# Patient Record
Sex: Male | Born: 1995 | Race: White | Hispanic: No | Marital: Single | Smoking: Never smoker
Health system: Southern US, Community
[De-identification: ages and names within clinical notes are randomized; demographics above are authoritative.]

## PROBLEM LIST (undated history)

## (undated) DIAGNOSIS — J9383 Other pneumothorax: Secondary | ICD-10-CM

---

## 2017-09-13 ENCOUNTER — Emergency Department
Admission: EM | Admit: 2017-09-13 | Discharge: 2017-09-13 | Disposition: A | Discharge status: Home / Self Care | Payer: BLUE CROSS/BLUE SHIELD | Attending: Student in an Organized Health Care Education/Training Program | Admitting: Student in an Organized Health Care Education/Training Program

## 2017-09-13 ENCOUNTER — Encounter: Payer: Self-pay | Admitting: *Deleted

## 2017-09-13 ENCOUNTER — Other Ambulatory Visit: Payer: Self-pay

## 2017-09-13 DIAGNOSIS — Y999 Unspecified external cause status: Secondary | ICD-10-CM | POA: Diagnosis not present

## 2017-09-13 DIAGNOSIS — X17XXXA Contact with hot engines, machinery and tools, initial encounter: Secondary | ICD-10-CM | POA: Diagnosis not present

## 2017-09-13 DIAGNOSIS — Y929 Unspecified place or not applicable: Secondary | ICD-10-CM | POA: Insufficient documentation

## 2017-09-13 DIAGNOSIS — S4991XA Unspecified injury of right shoulder and upper arm, initial encounter: Secondary | ICD-10-CM | POA: Diagnosis present

## 2017-09-13 DIAGNOSIS — T22191A Burn of first degree of multiple sites of right shoulder and upper limb, except wrist and hand, initial encounter: Secondary | ICD-10-CM | POA: Diagnosis not present

## 2017-09-13 DIAGNOSIS — Y9389 Activity, other specified: Secondary | ICD-10-CM | POA: Diagnosis not present

## 2017-09-13 MED ORDER — HYDROMORPHONE HCL 1 MG/ML IJ SOLN
1.0000 mg | Freq: Once | INTRAMUSCULAR | Status: DC
Start: 2017-09-13 — End: 2017-09-13
  Filled 2017-09-13: qty 1

## 2017-09-13 MED ORDER — TRAMADOL HCL 50 MG PO TABS
50.0000 mg | ORAL_TABLET | Freq: Once | ORAL | Status: DC
Start: 2017-09-13 — End: 2017-09-13

## 2017-09-13 MED ORDER — SILVER SULFADIAZINE 1 % EX CREA: TOPICAL_CREAM | Freq: Once | CUTANEOUS | Status: DC

## 2017-09-13 MED ORDER — SILVER SULFADIAZINE 1 % EX CREA
TOPICAL_CREAM | Freq: Once | CUTANEOUS | Status: AC
  Administered 2017-09-13: 20:00:00 via TOPICAL
  Filled 2017-09-13: qty 85

## 2017-09-13 MED ORDER — TRAMADOL HCL 50 MG PO TABS: 50.0000 mg | ORAL_TABLET | Freq: Two times a day (BID) | ORAL | 0 refills | Status: DC | PRN

## 2017-09-13 MED ORDER — SILVER SULFADIAZINE 1 % EX CREA: TOPICAL_CREAM | CUTANEOUS | 1 refills | Status: AC

## 2017-09-13 MED ORDER — IBUPROFEN 600 MG PO TABS: 600.0000 mg | ORAL_TABLET | Freq: Three times a day (TID) | ORAL | 0 refills | Status: DC | PRN

## 2017-09-13 NOTE — ED Notes (Signed)
Esign not working pt family verbalized discharge instructions and has no questions at this time
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
## 2019-01-04 ENCOUNTER — Encounter: Payer: Self-pay | Admitting: Emergency Medicine

## 2019-01-04 ENCOUNTER — Other Ambulatory Visit: Payer: Self-pay

## 2019-01-04 ENCOUNTER — Other Ambulatory Visit: Payer: Self-pay | Admitting: Unknown Physician Specialty

## 2019-01-04 ENCOUNTER — Inpatient Hospital Stay
Admission: EM | Admit: 2019-01-04 | Discharge: 2019-01-10 | DRG: 165 | Disposition: A | Discharge status: Home / Self Care | Payer: BC Managed Care – PPO | Attending: Cardiothoracic Surgery | Admitting: Cardiothoracic Surgery

## 2019-01-04 ENCOUNTER — Ambulatory Visit
Admission: RE | Admit: 2019-01-04 | Discharge: 2019-01-04 | Disposition: A | Discharge status: Home / Self Care | Payer: BC Managed Care – PPO | Source: Ambulatory Visit | Attending: Unknown Physician Specialty | Admitting: Unknown Physician Specialty

## 2019-01-04 ENCOUNTER — Inpatient Hospital Stay: Payer: BC Managed Care – PPO

## 2019-01-04 ENCOUNTER — Emergency Department: Payer: BC Managed Care – PPO

## 2019-01-04 DIAGNOSIS — Z938 Other artificial opening status: Secondary | ICD-10-CM

## 2019-01-04 DIAGNOSIS — Z4682 Encounter for fitting and adjustment of non-vascular catheter: Secondary | ICD-10-CM

## 2019-01-04 DIAGNOSIS — Z9689 Presence of other specified functional implants: Secondary | ICD-10-CM

## 2019-01-04 DIAGNOSIS — R0602 Shortness of breath: Secondary | ICD-10-CM

## 2019-01-04 DIAGNOSIS — Z20828 Contact with and (suspected) exposure to other viral communicable diseases: Secondary | ICD-10-CM | POA: Diagnosis present

## 2019-01-04 DIAGNOSIS — R42 Dizziness and giddiness: Secondary | ICD-10-CM | POA: Diagnosis not present

## 2019-01-04 DIAGNOSIS — J9383 Other pneumothorax: Principal | ICD-10-CM | POA: Diagnosis present

## 2019-01-04 DIAGNOSIS — J939 Pneumothorax, unspecified: Secondary | ICD-10-CM

## 2019-01-04 DIAGNOSIS — Z09 Encounter for follow-up examination after completed treatment for conditions other than malignant neoplasm: Secondary | ICD-10-CM

## 2019-01-04 DIAGNOSIS — Z8709 Personal history of other diseases of the respiratory system: Secondary | ICD-10-CM | POA: Diagnosis not present

## 2019-01-04 HISTORY — DX: Other pneumothorax: J93.83

## 2019-01-04 LAB — CBC WITH DIFFERENTIAL/PLATELET
Abs Immature Granulocytes: 0.05 10*3/uL (ref 0.00–0.07)
Basophils Absolute: 0.1 10*3/uL (ref 0.0–0.1)
Basophils Relative: 1 %
Eosinophils Absolute: 0 10*3/uL (ref 0.0–0.5)
Eosinophils Relative: 0 %
HCT: 46.8 % (ref 39.0–52.0)
Hemoglobin: 16.6 g/dL (ref 13.0–17.0)
Immature Granulocytes: 0 %
Lymphocytes Relative: 11 %
Lymphs Abs: 1.3 10*3/uL (ref 0.7–4.0)
MCH: 30.9 pg (ref 26.0–34.0)
MCHC: 35.5 g/dL (ref 30.0–36.0)
MCV: 87 fL (ref 80.0–100.0)
Monocytes Absolute: 0.6 10*3/uL (ref 0.1–1.0)
Monocytes Relative: 5 %
Neutro Abs: 9.7 10*3/uL — ABNORMAL HIGH (ref 1.7–7.7)
Neutrophils Relative %: 83 %
Platelets: 231 10*3/uL (ref 150–400)
RBC: 5.38 MIL/uL (ref 4.22–5.81)
RDW: 12 % (ref 11.5–15.5)
WBC: 11.8 10*3/uL — ABNORMAL HIGH (ref 4.0–10.5)
nRBC: 0 % (ref 0.0–0.2)

## 2019-01-04 LAB — BASIC METABOLIC PANEL
Anion gap: 9 (ref 5–15)
BUN: 16 mg/dL (ref 6–20)
CO2: 29 mmol/L (ref 22–32)
Calcium: 10 mg/dL (ref 8.9–10.3)
Chloride: 101 mmol/L (ref 98–111)
Creatinine, Ser: 0.92 mg/dL (ref 0.61–1.24)
GFR calc Af Amer: >60 mL/min (ref >60)
GFR calc non Af Amer: >60 mL/min (ref >60)
Glucose, Bld: 95 mg/dL (ref 70–99)
Potassium: 3.9 mmol/L (ref 3.5–5.1)
Sodium: 139 mmol/L (ref 135–145)

## 2019-01-04 LAB — SARS CORONAVIRUS 2 BY RT PCR (HOSPITAL ORDER, PERFORMED IN ~~LOC~~ HOSPITAL LAB): SARS Coronavirus 2: NEGATIVE

## 2019-01-04 MED ORDER — FENTANYL CITRATE (PF) 100 MCG/2ML IJ SOLN
50.0000 ug | Freq: Once | INTRAMUSCULAR | Status: AC
  Administered 2019-01-04: 17:00:00 50 ug via INTRAVENOUS
  Filled 2019-01-04: qty 2

## 2019-01-04 MED ORDER — ONDANSETRON HCL 4 MG/2ML IJ SOLN: 4.0000 mg | Freq: Four times a day (QID) | INTRAMUSCULAR | Status: DC | PRN

## 2019-01-04 MED ORDER — ENOXAPARIN SODIUM 40 MG/0.4ML ~~LOC~~ SOLN
40.0000 mg | SUBCUTANEOUS | Status: DC
  Administered 2019-01-05: 21:00:00 40 mg via SUBCUTANEOUS
  Filled 2019-01-04 (×2): qty 0.4

## 2019-01-04 MED ORDER — ACETAMINOPHEN 650 MG RE SUPP: 650.0000 mg | Freq: Four times a day (QID) | RECTAL | Status: DC | PRN

## 2019-01-04 MED ORDER — IOHEXOL 300 MG/ML  SOLN
75.0000 mL | Freq: Once | INTRAMUSCULAR | Status: AC | PRN
  Administered 2019-01-04: 20:00:00 75 mL via INTRAVENOUS

## 2019-01-04 MED ORDER — FENTANYL CITRATE (PF) 100 MCG/2ML IJ SOLN
INTRAMUSCULAR | Status: AC
  Administered 2019-01-04: 50 ug via INTRAVENOUS
  Filled 2019-01-04: qty 2

## 2019-01-04 MED ORDER — FENTANYL CITRATE (PF) 100 MCG/2ML IJ SOLN
50.0000 ug | Freq: Once | INTRAMUSCULAR | Status: AC
  Administered 2019-01-04: 18:00:00 50 ug via INTRAVENOUS

## 2019-01-04 MED ORDER — MIDAZOLAM HCL 2 MG/2ML IJ SOLN
2.0000 mg | Freq: Once | INTRAMUSCULAR | Status: AC
  Administered 2019-01-04: 17:00:00 2 mg via INTRAVENOUS
  Filled 2019-01-04: qty 2

## 2019-01-04 MED ORDER — MORPHINE SULFATE (PF) 4 MG/ML IV SOLN
INTRAVENOUS | Status: AC
  Administered 2019-01-04: 4 mg via INTRAVENOUS
  Filled 2019-01-04: qty 1

## 2019-01-04 MED ORDER — MORPHINE SULFATE (PF) 4 MG/ML IV SOLN
4.0000 mg | INTRAVENOUS | Status: DC | PRN
  Administered 2019-01-04 – 2019-01-06 (×7): 4 mg via INTRAVENOUS
  Filled 2019-01-04 (×7): qty 1

## 2019-01-04 MED ORDER — ONDANSETRON 4 MG PO TBDP: 4.0000 mg | ORAL_TABLET | Freq: Four times a day (QID) | ORAL | Status: DC | PRN

## 2019-01-04 MED ORDER — LIDOCAINE HCL (PF) 1 % IJ SOLN
10.0000 mL | Freq: Once | INTRAMUSCULAR | Status: AC
  Administered 2019-01-04: 18:00:00 10 mL via INTRADERMAL

## 2019-01-04 MED ORDER — LIDOCAINE HCL (PF) 1 % IJ SOLN
INTRAMUSCULAR | Status: AC
  Filled 2019-01-04: qty 20

## 2019-01-04 MED ORDER — ACETAMINOPHEN 325 MG PO TABS: 650.0000 mg | ORAL_TABLET | Freq: Four times a day (QID) | ORAL | Status: DC | PRN

## 2019-01-04 MED ORDER — FENTANYL CITRATE (PF) 100 MCG/2ML IJ SOLN
50.0000 ug | Freq: Once | INTRAMUSCULAR | Status: AC
Start: 2019-01-04 — End: 2019-01-04
  Administered 2019-01-04: 18:00:00 50 ug via INTRAVENOUS

## 2019-01-04 MED ORDER — HYDROCODONE-ACETAMINOPHEN 5-325 MG PO TABS
1.0000 | ORAL_TABLET | ORAL | Status: DC | PRN
  Administered 2019-01-04 – 2019-01-06 (×3): 2 via ORAL
  Filled 2019-01-04 (×3): qty 2

## 2019-01-04 MED ORDER — MORPHINE SULFATE (PF) 4 MG/ML IV SOLN
4.0000 mg | Freq: Once | INTRAVENOUS | Status: AC
  Administered 2019-01-04: 19:00:00 4 mg via INTRAVENOUS

## 2019-01-04 MED ORDER — SODIUM CHLORIDE 0.9 % IV SOLN
INTRAVENOUS | Status: DC
  Administered 2019-01-04 – 2019-01-05 (×2): via INTRAVENOUS

## 2019-01-04 NOTE — ED Provider Notes (Addendum)
[facility] Emergency Department Provider Note   ____________________________________________   First MD Initiated Contact with Patient 01/04/19 1640     (approximate)  I have reviewed the triage vital signs and the nursing notes.   HISTORY  Chief Complaint Pneumothorax    HPI Zachary Lloyd is a 23 y.o. male with past medical history of spontaneous pneumothorax who presents to the ED complaining of chest pain.  Patient reports that when he woke up this morning he had sharp pain in the right side of his chest that seemed to worsen with a deep breath.  He thought that pain felt similar to his prior spontaneous pneumothorax and got in touch with a physician friend, who was able to order a chest x-ray.  Outpatient chest x-ray showed spontaneous pneumothorax and patient was referred to the ED for further evaluation.  Patient reports ongoing chest pain on the right side, states it is painful for him to breathe but he does not feel short of breath.  He has not taken any medication for pain prior to arrival.        Past Medical History:  Diagnosis Date  . Spontaneous pneumothorax     Patient Active Problem List   Diagnosis Date Noted  . 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 No pertinent surgical history.  Prior to Admission medications   Not on File    Allergies Patient has no known allergies.  No family history on file.  Social History Social History   Tobacco Use  . Smoking status: Never Smoker  . Smokeless tobacco: Never Used  Substance Use Topics  . Alcohol use: Yes  . Drug use: Never    Review of Systems  Constitutional: No fever/chills Eyes: No visual changes. ENT: No sore throat. Cardiovascular: Positive for chest pain. Respiratory: Denies shortness of breath. Gastrointestinal: No abdominal pain.  No nausea, no vomiting.  No diarrhea.  No constipation. Genitourinary: Negative for dysuria.  Musculoskeletal: Negative for back pain. Skin: Negative for rash. Neurological: Negative for headaches, focal weakness or numbness.  ____________________________________________   PHYSICAL EXAM:  VITAL SIGNS: ED Triage Vitals  Enc Vitals Group     BP 01/04/19 1635 115/75     Pulse Rate 01/04/19 1635 100     Resp 01/04/19 1635 16     Temp 01/04/19 1635 98.5 F (36.9 C)     Temp Source 01/04/19 1635 Oral     SpO2 01/04/19 1635 96 %     Weight 01/04/19 1636 195 lb (88.5 kg)     Height 01/04/19 1636 5\' 11"  (1.803 m)     Head Circumference --      Peak Flow --      Pain Score 01/04/19 1636 2     Pain Loc --      Pain Edu? --      Excl. in GC? --     Constitutional: Alert and oriented. Eyes: Conjunctivae are normal. Head: Atraumatic. Nose: No congestion/rhinnorhea. Mouth/Throat: Mucous membranes are moist. Neck: Normal ROM Cardiovascular: Normal rate, regular rhythm. Grossly normal heart sounds. Respiratory: Normal respiratory effort.  No retractions.  Diminished lung sounds on right, otherwise clear. Gastrointestinal: Soft and nontender. No distention. Genitourinary: deferred Musculoskeletal: No lower extremity tenderness nor edema. Neurologic:  Normal speech and language. No gross focal neurologic deficits are appreciated. Skin:  Skin is warm, dry and intact. No rash noted. Psychiatric: Mood and affect are normal. Speech and behavior are normal.  ____________________________________________   LABS (  all labs ordered are listed, but only abnormal results are displayed)  Labs Reviewed  CBC WITH DIFFERENTIAL/PLATELET - Abnormal; Notable for the following components:      Result Value   WBC 11.8 (*)    Neutro Abs 9.7 (*)    All other components within normal limits  SARS CORONAVIRUS 2 (HOSPITAL ORDER, [facility])  BASIC METABOLIC PANEL  HIV ANTIBODY (ROUTINE TESTING W REFLEX)     PROCEDURES  Procedure(s) performed (including Critical  Care):  CHEST TUBE INSERTION  Date/Time: 01/04/2019 6:50 PM Performed by: Blake Divine, MD Authorized by: Blake Divine, MD   Consent:    Consent obtained:  Verbal   Consent given by:  Patient   Risks discussed:  Bleeding, incomplete drainage, nerve damage, pain, infection and damage to surrounding structures   Alternatives discussed:  Delayed treatment Pre-procedure details:    Skin preparation:  ChloraPrep   Preparation: Patient was prepped and draped in the usual sterile fashion   Sedation:    Sedation type:  Moderate (conscious) sedation Anesthesia (see MAR for exact dosages):    Anesthesia method:  Local infiltration   Local anesthetic:  Lidocaine 1% w/o epi Procedure details:    Placement location:  R lateral   Tube size (Fr):  Minicatheter   Ultrasound guidance: no     Tension pneumothorax: no     Tube connected to:  Water seal and suction   Drainage characteristics:  Air only   Suture material:  2-0 silk   Dressing:  Xeroform gauze and 4x4 sterile gauze Post-procedure details:    Post-insertion x-ray findings: tube in good position     Patient tolerance of procedure:  Tolerated well, no immediate complications  .Critical Care Performed by: Blake Divine, MD Authorized by: Blake Divine, MD   Critical care provider statement:    Critical care time (minutes):  45   Critical care time was exclusive of:  Separately billable procedures and treating other patients and teaching time   Critical care was necessary to treat or prevent imminent or life-threatening deterioration of the following conditions:  Respiratory failure   Critical care was time spent personally by me on the following activities:  Discussions with consultants, evaluation of patient's response to treatment, examination of patient, ordering and performing treatments and interventions, ordering and review of laboratory studies, ordering and review of radiographic studies, pulse oximetry,  re-evaluation of patient's condition, obtaining history from patient or surrogate and review of old charts   I assumed direction of critical care for this patient from another provider in my specialty: no       ____________________________________________   INITIAL IMPRESSION / ASSESSMENT AND PLAN / ED COURSE       23 year old male presents to the ED with spontaneous pneumothorax discovered on outpatient imaging.  Reviewed x-ray, agree with finding of spontaneous pneumothorax occupying approximately 50% of right hemithorax.  Case discussed with Dr. Faith Rogue of thoracic surgery, agrees with plan for chest tube placement and admission, recommend speaking with general surgery.  Dr. Faith Rogue will plan for likely procedure in the morning.  Case discussed with general surgery, who will admit patient to their service for further management.  Patient was sedated with fentanyl and Versed, had small bore chest tube placed without issue, follow-up x-ray shows appropriate lung re-expansion.  Patient complains of significant pain to his right arm, remains neurovascularly intact to this arm, will treat with additional pain medication.      ____________________________________________   FINAL  CLINICAL IMPRESSION(S) / ED DIAGNOSES  Final diagnoses:  Spontaneous pneumothorax     ED Discharge Orders    None       Note:  This document was prepared using Dragon voice recognition software and may include unintentional dictation errors.   Chesley NoonJessup, Selvin Yun, MD 01/04/19 Daphane [id number]    Chesley NoonJessup, Eulises Kijowski, MD 01/24/19 [id number]

## 2019-01-04 NOTE — ED Triage Notes (Signed)
Pt in via POV, reports confirmed right pneumothorax upon outpatient imaging today.  Pt states this is second spontaneous pneumo, last one being December 2019.  Pt does report increasing shortness of breath, NAD noted at this time.  Vitals WDL.

## 2019-01-04 NOTE — H&P (Signed)
SURGICAL CONSULTATION NOTE   HISTORY OF PRESENT ILLNESS (HPI):  23 y.o. male presented to [facility] ED for evaluation of chest pain. Patient reports started having chest pain while working today. Pain on right side of the chest. No pain radiation. No alleviating or aggravating factor. Pain associated with shortness of breath. This was a similar pain as last year when he was diagnosed with pneumothorax. Patient was treated with chest tube and resolved. Denies any trauma. Denies smoking.   Chest xray was done today showing a moderate size right pneumothorax. Chest tube placed by ED physician. New chest xray shows lung re expansion.   Surgery is consulted by Dr. Charna Archer in this context for evaluation and management of spontaneous pneumothorax.  PAST MEDICAL HISTORY ([facility]):  Past Medical History:  Diagnosis Date  . Spontaneous pneumothorax      PAST SURGICAL HISTORY ([place]):  History reviewed. No pertinent surgical history.   MEDICATIONS:  Prior to Admission medications   Medication Sig Start Date End Date Taking? Authorizing Provider  ibuprofen (ADVIL,MOTRIN) 600 MG tablet Take 1 tablet (600 mg total) by mouth every 8 (eight) hours as needed. 09/13/17   Sable Feil, PA-C  traMADol (ULTRAM) 50 MG tablet Take 1 tablet (50 mg total) by mouth every 12 (twelve) hours as needed. 09/13/17   Sable Feil, PA-C     ALLERGIES:  No Known Allergies   SOCIAL HISTORY:  Social History   Socioeconomic History  . Marital status: Single    Spouse name: Not on file  . Number of children: Not on file  . Years of education: Not on file  . Highest education level: Not on file  Occupational History  . Not on file  Social Needs  . Financial resource strain: Not on file  . Food insecurity    Worry: Not on file    Inability: Not on file  . Transportation needs    Medical: Not on file    Non-medical: Not on file  Tobacco Use  . Smoking status: Never Smoker  . Smokeless tobacco: Never Used  Substance  and Sexual Activity  . Alcohol use: Yes  . Drug use: Never  . Sexual activity: Not on file  Lifestyle  . Physical activity    Days per week: Not on file    Minutes per session: Not on file  . Stress: Not on file  Relationships  . Social Herbalist on phone: Not on file    Gets together: Not on file    Attends religious service: Not on file    Active member of club or organization: Not on file    Attends meetings of clubs or organizations: Not on file    Relationship status: Not on file  . Intimate partner violence    Fear of current or ex partner: Not on file    Emotionally abused: Not on file    Physically abused: Not on file    Forced sexual activity: Not on file  Other Topics Concern  . Not on file  Social History Narrative  . Not on file    The patient currently resides (home / rehab facility / nursing home): Home The patient normally is (ambulatory / bedbound): Ambulatory   FAMILY HISTORY:  No family history on file.   REVIEW OF SYSTEMS:  Constitutional: denies weight loss, fever, chills, or sweats  Eyes: denies any other vision changes, history of eye injury  ENT: denies sore throat, hearing problems  Respiratory:  positive shortness of breath and right sided chest pain, negative wheezing  Cardiovascular: palpitations  Gastrointestinal: denies abdominal pain, N/V, or diarrhea/and bowel function as per HPI Genitourinary: denies burning with urination or urinary frequency Musculoskeletal: denies any other joint pains or cramps  Skin: denies any other rashes or skin discolorations  Neurological: denies any other headache, dizziness, weakness  Psychiatric: denies any other depression, anxiety   All other review of systems were negative   VITAL SIGNS:  Temp:  [98.5 F (36.9 C)] 98.5 F (36.9 C) (08/27 1635) Pulse Rate:  [70-100] 70 (08/27 1826) Resp:  [12-16] 12 (08/27 1826) BP: (112-115)/(67-75) 112/67 (08/27 1826) SpO2:  [96 %-99 %] 99 % (08/27  1826) Weight:  [88.5 kg] 88.5 kg (08/27 1636)     Height: 5\' 11"  (180.3 cm) Weight: 88.5 kg BMI (Calculated): 27.21   INTAKE/OUTPUT:  This shift: No intake/output data recorded.  Last 2 shifts: @IOLAST2SHIFTS @   PHYSICAL EXAM:  Constitutional:  -- Normal body habitus  -- Awake, alert, and oriented x3  Eyes:  -- Pupils equally round and reactive to light  -- No scleral icterus  Ear, nose, and throat:  -- No jugular venous distension  Pulmonary:  -- No crackles  -- Equal breath sounds bilaterally -- Breathing non-labored at rest --chest tube in place Cardiovascular:  -- S1, S2 present  -- No pericardial rubs Gastrointestinal:  -- Abdomen soft, nontender, non-distended, no guarding or rebound tenderness -- No abdominal masses appreciated, pulsatile or otherwise  Musculoskeletal and Integumentary:  -- Wounds or skin discoloration: None appreciated -- Extremities: B/L UE and LE FROM, hands and feet warm, no edema  Neurologic:  -- Motor function: intact and symmetric -- Sensation: intact and symmetric   Labs:  CBC Latest Ref Rng & Units 01/04/2019  WBC 4.0 - 10.5 K/uL 11.8(H)  Hemoglobin 13.0 - 17.0 g/dL 08.6  Hematocrit 57.8 - 52.0 % 46.8  Platelets 150 - 400 K/uL 231   CMP Latest Ref Rng & Units 01/04/2019  Glucose 70 - 99 mg/dL 95  BUN 6 - 20 mg/dL 16  Creatinine 4.69 - 6.29 mg/dL 5.28  Sodium 413 - 244 mmol/L 139  Potassium 3.5 - 5.1 mmol/L 3.9  Chloride 98 - 111 mmol/L 101  CO2 22 - 32 mmol/L 29  Calcium 8.9 - 10.3 mg/dL 01.0   Imaging studies: I personally evaluated the pre chest tube and post chest tube xray.  EXAM: CHEST - 2 VIEW  COMPARISON:  None.  FINDINGS: The heart size and mediastinal contours are within normal limits. There is a moderate right apical and lateral pneumothorax. The visualized skeletal structures are unremarkable.  IMPRESSION: There is a moderate right apical and lateral pneumothorax. No displaced rib fracture or other obvious  radiographic abnormality.  These results were called by telephone at the time of interpretation on 01/04/2019 at 3:42 pm to Dr. Linus Salmons , who verbally acknowledged these results.   Electronically Signed   By: Lauralyn Primes M.D.   On: 01/04/2019 15:47  EXAM: PORTABLE CHEST 1 VIEW  COMPARISON:  Chest radiograph 01/04/2019  FINDINGS: The heart size and mediastinal contours are within normal limits. There has been interval placement of a right chest tube with re-expansion of the right lung. No definite residual pneumothorax is appreciated. No new focal pulmonary opacity.  IMPRESSION: Interval placement of right chest tube with re-expansion of the right lung. No definite residual pneumothorax identified.   Electronically Signed   By: Emmaline Kluver M.D.   On:  01/04/2019 18:45  Assessment/Plan:  23 y.o. male with recurrent right sided pneumothorax, spontaneous.   Patient oriented about diagnosis. Chest tube placed by ED physician. New xray show re expansion of the lung. I personally evaluated the images. Will order CT scan of the chest for lung parenchyma evaluation to identify any pathology that can explain the recurrent pneumothorax. With this study, Thoracic surgeon will discuss with patient and family about surgical alternatives. Patient can eat today before midnight. NPO after midnight for possible surgical management. COVID-19 test changed to in hospital test to have it available for surgery tomorrow morning. Chest tube will be placed to suction. Will order pain management and DVT prophylaxis. Dr. Thelma Bargeaks aware of case and will follow tomorrow.   All of the above findings and recommendations were discussed with the patient and his amily, and all of patient's and his family's questions were answered to their expressed satisfaction.  Gae GallopEdgardo Cintrn-Daz, MD

## 2019-01-04 NOTE — ED Notes (Signed)
Pt placed on 2L supplemental oxygen per MD Jessup orders.

## 2019-01-04 NOTE — ED Notes (Signed)
ED TO INPATIENT HANDOFF REPORT  ED Nurse Name and Phone #: Quillian Quince [place] Name/Age/Gender Zachary Lloyd 23 y.o. male Room/Bed: ED19A/ED19A  Code Status   Code Status: Full Code  Home/SNF/Other Home Patient oriented to: self, place, time and situation Is this baseline? Yes   Triage Complete: Triage complete  Chief Complaint Poss collapsed lung  Triage Note Pt in via POV, reports confirmed right pneumothorax upon outpatient imaging today.  Pt states this is second spontaneous pneumo, last one being December 2019.  Pt does report increasing shortness of breath, NAD noted at this time.  Vitals WDL.    Allergies No Known Allergies  Level of Care/Admitting Diagnosis ED Disposition    ED Disposition Condition [facility] Area: [place] [100120]  Level of Care: Med-Surg [16]  Covid Evaluation: Asymptomatic Screening Protocol (No Symptoms)  Diagnosis: Recurrent spontaneous pneumothorax [253664]  Admitting Physician: Herbert Pun [[id number]]  Attending Physician: Herbert Pun [[id number]]  Estimated length of stay: past midnight tomorrow  Certification:: I certify this patient will need inpatient services for at least 2 midnights  PT Class (Do Not Modify): Inpatient [101]  PT Acc Code (Do Not Modify): Private [1]       B Medical/Surgery History Past Medical History:  Diagnosis Date  . Spontaneous pneumothorax    History reviewed. No pertinent surgical history.   A IV Location/Drains/Wounds Patient Lines/Drains/Airways Status   Active Line/Drains/Airways    Name:   Placement date:   Placement time:   Site:   Days:   Peripheral IV 01/04/19 Left Antecubital   01/04/19    1652    Antecubital   less than 1          Intake/Output Last 24 hours No intake or output data in the 24 hours ending 01/04/19 1921  Labs/Imaging Results for orders placed or performed during the hospital encounter of 01/04/19 (from the  past 48 hour(s))  CBC with Differential     Status: Abnormal   Collection Time: 01/04/19  4:48 PM  Result Value Ref Range   WBC 11.8 (H) 4.0 - 10.5 K/uL   RBC 5.38 4.22 - 5.81 MIL/uL   Hemoglobin 16.6 13.0 - 17.0 g/dL   HCT 46.8 39.0 - 52.0 %   MCV 87.0 80.0 - 100.0 fL   MCH 30.9 26.0 - 34.0 pg   MCHC 35.5 30.0 - 36.0 g/dL   RDW 12.0 11.5 - 15.5 %   Platelets 231 150 - 400 K/uL   nRBC 0.0 0.0 - 0.2 %   Neutrophils Relative % 83 %   Neutro Abs 9.7 (H) 1.7 - 7.7 K/uL   Lymphocytes Relative 11 %   Lymphs Abs 1.3 0.7 - 4.0 K/uL   Monocytes Relative 5 %   Monocytes Absolute 0.6 0.1 - 1.0 K/uL   Eosinophils Relative 0 %   Eosinophils Absolute 0.0 0.0 - 0.5 K/uL   Basophils Relative 1 %   Basophils Absolute 0.1 0.0 - 0.1 K/uL   Immature Granulocytes 0 %   Abs Immature Granulocytes 0.05 0.00 - 0.07 K/uL    Comment: Performed at [facility], [address] [place],  [address]  Basic metabolic panel     Status: None   Collection Time: 01/04/19  4:48 PM  Result Value Ref Range   Sodium 139 135 - 145 mmol/L   Potassium 3.9 3.5 - 5.1 mmol/L   Chloride 101 98 - 111 mmol/L   CO2 29  22 - 32 mmol/L   Glucose, Bld 95 70 - 99 mg/dL   BUN 16 6 - 20 mg/dL   Creatinine, Ser 2.50 0.61 - 1.24 mg/dL   Calcium 53.9 8.9 - 76.7 mg/dL   GFR calc non Af Amer >60 >60 mL/min   GFR calc Af Amer >60 >60 mL/min   Anion gap 9 5 - 15    Comment: Performed at [facility], [address] [place], [place] [address]   Dg Chest 2 View  Result Date: 01/04/2019 CLINICAL DATA:  Shortness of breath, history of spontaneous pneumothorax EXAM: CHEST - 2 VIEW COMPARISON:  None. FINDINGS: The heart size and mediastinal contours are within normal limits. There is a moderate right apical and lateral pneumothorax. The visualized skeletal structures are unremarkable. IMPRESSION: There is a moderate right apical and lateral pneumothorax. No displaced rib fracture or other obvious radiographic  abnormality. These results were called by telephone at the time of interpretation on 01/04/2019 at 3:42 pm to Dr. Linus Salmons , who verbally acknowledged these results. Electronically Signed   By: Lauralyn Primes M.D.   On: 01/04/2019 15:47   Dg Chest Portable 1 View  Result Date: 01/04/2019 CLINICAL DATA:  S/p chest tube. right pneumothorax upon outpatient imaging today. Pt states this is second spontaneous pneumo, increased sob. Non smoker. EXAM: PORTABLE CHEST 1 VIEW COMPARISON:  Chest radiograph 01/04/2019 FINDINGS: The heart size and mediastinal contours are within normal limits. There has been interval placement of a right chest tube with re-expansion of the right lung. No definite residual pneumothorax is appreciated. No new focal pulmonary opacity. IMPRESSION: Interval placement of right chest tube with re-expansion of the right lung. No definite residual pneumothorax identified. Electronically Signed   By: Emmaline Kluver M.D.   On: 01/04/2019 18:45    Pending Labs Unresulted Labs (From admission, onward)    Start     Ordered   01/04/19 1911  HIV antibody (Routine Testing)  Once,   STAT     01/04/19 1914   01/04/19 1910  SARS Coronavirus 2 [facility] order, Performed in [facility] hospital lab)  Once,   STAT     01/04/19 1910          Vitals/Pain Today's Vitals   01/04/19 1635 01/04/19 1636 01/04/19 1819 01/04/19 1826  BP: 115/75   112/67  Pulse: 100   70  Resp: 16   12  Temp: 98.5 F (36.9 C)     TempSrc: Oral     SpO2: 96%   99%  Weight:  88.5 kg    Height:  5\' 11"  (1.803 m)    PainSc:  2  9      Isolation Precautions No active isolations  Medications Medications  enoxaparin (LOVENOX) injection 40 mg (has no administration in time range)  0.9 %  sodium chloride infusion (has no administration in time range)  acetaminophen (TYLENOL) tablet 650 mg (has no administration in time range)    Or  acetaminophen (TYLENOL) suppository 650 mg (has no administration in  time range)  HYDROcodone-acetaminophen (NORCO/VICODIN) 5-325 MG per tablet 1-2 tablet (has no administration in time range)  morphine 4 MG/ML injection 4 mg (has no administration in time range)  ondansetron (ZOFRAN-ODT) disintegrating tablet 4 mg (has no administration in time range)    Or  ondansetron (ZOFRAN) injection 4 mg (has no administration in time range)  midazolam (VERSED) injection 2 mg (2 mg Intravenous Given 01/04/19 1724)  fentaNYL (SUBLIMAZE) injection 50 mcg (  50 mcg Intravenous Given 01/04/19 1727)  fentaNYL (SUBLIMAZE) injection 50 mcg (50 mcg Intravenous Given 01/04/19 1824)  lidocaine (PF) (XYLOCAINE) 1 % injection 10 mL (10 mLs Intradermal Given 01/04/19 1823)  fentaNYL (SUBLIMAZE) injection 50 mcg (50 mcg Intravenous Given 01/04/19 1825)  morphine 4 MG/ML injection 4 mg (4 mg Intravenous Given 01/04/19 1844)    Mobility walks Low fall risk   Focused Assessments Respiratory   R Recommendations: See Admitting Provider Note  Report given to:   Additional Notes:

## 2019-01-04 NOTE — ED Notes (Signed)
Swabbed patient with rapid Covid swab per protocol. Requested order from MD for swab. Swab is in laboratory's possession.

## 2019-01-05 ENCOUNTER — Inpatient Hospital Stay: Payer: BC Managed Care – PPO

## 2019-01-05 ENCOUNTER — Inpatient Hospital Stay: Payer: BC Managed Care – PPO | Admitting: Certified Registered"

## 2019-01-05 ENCOUNTER — Encounter
Admission: EM | Disposition: A | Discharge status: Home / Self Care | Payer: Self-pay | Source: Home / Self Care | Attending: Cardiothoracic Surgery

## 2019-01-05 ENCOUNTER — Encounter: Payer: Self-pay | Admitting: *Deleted

## 2019-01-05 DIAGNOSIS — J9383 Other pneumothorax: Secondary | ICD-10-CM

## 2019-01-05 HISTORY — PX: VIDEO ASSISTED THORACOSCOPY (VATS)/WEDGE RESECTION: SHX6174

## 2019-01-05 LAB — TYPE AND SCREEN
ABO/RH(D): A POS
Antibody Screen: NEGATIVE

## 2019-01-05 LAB — SURGICAL PCR SCREEN
MRSA, PCR: NEGATIVE
Staphylococcus aureus: POSITIVE — AB

## 2019-01-05 SURGERY — VIDEO ASSISTED THORACOSCOPY (VATS)/WEDGE RESECTION
Anesthesia: General | Laterality: Right

## 2019-01-05 MED ORDER — HYDROMORPHONE HCL 1 MG/ML IJ SOLN
INTRAMUSCULAR | Status: AC
  Administered 2019-01-05: 16:00:00 0.25 mg via INTRAVENOUS
  Filled 2019-01-05: qty 1

## 2019-01-05 MED ORDER — BUPIVACAINE LIPOSOME 1.3 % IJ SUSP
INTRAMUSCULAR | Status: AC
  Filled 2019-01-05: qty 20

## 2019-01-05 MED ORDER — SUGAMMADEX SODIUM 200 MG/2ML IV SOLN
INTRAVENOUS | Status: AC
  Filled 2019-01-05: qty 2

## 2019-01-05 MED ORDER — MIDAZOLAM HCL 2 MG/2ML IJ SOLN
INTRAMUSCULAR | Status: DC | PRN
  Administered 2019-01-05: 2 mg via INTRAVENOUS

## 2019-01-05 MED ORDER — BUPIVACAINE LIPOSOME 1.3 % IJ SUSP
INTRAMUSCULAR | Status: DC | PRN
  Administered 2019-01-05: 20 mL

## 2019-01-05 MED ORDER — DEXMEDETOMIDINE HCL 200 MCG/2ML IV SOLN
INTRAVENOUS | Status: DC | PRN
  Administered 2019-01-05: 4 ug via INTRAVENOUS
  Administered 2019-01-05: 8 ug via INTRAVENOUS

## 2019-01-05 MED ORDER — LACTATED RINGERS IV SOLN
INTRAVENOUS | Status: DC | PRN
  Administered 2019-01-05: 13:00:00 via INTRAVENOUS

## 2019-01-05 MED ORDER — HYDROMORPHONE HCL 1 MG/ML IJ SOLN
0.2500 mg | INTRAMUSCULAR | Status: AC | PRN
  Administered 2019-01-05 (×4): 0.25 mg via INTRAVENOUS

## 2019-01-05 MED ORDER — OXYCODONE HCL 5 MG PO TABS: 5.0000 mg | ORAL_TABLET | Freq: Once | ORAL | Status: DC | PRN

## 2019-01-05 MED ORDER — DEXAMETHASONE SODIUM PHOSPHATE 10 MG/ML IJ SOLN
INTRAMUSCULAR | Status: DC | PRN
  Administered 2019-01-05: 10 mg via INTRAVENOUS

## 2019-01-05 MED ORDER — CEFAZOLIN SODIUM-DEXTROSE 2-4 GM/100ML-% IV SOLN
INTRAVENOUS | Status: AC
  Filled 2019-01-05: qty 100

## 2019-01-05 MED ORDER — ROCURONIUM BROMIDE 100 MG/10ML IV SOLN
INTRAVENOUS | Status: DC | PRN
  Administered 2019-01-05: 10 mg via INTRAVENOUS
  Administered 2019-01-05: 50 mg via INTRAVENOUS
  Administered 2019-01-05: 10 mg via INTRAVENOUS
  Administered 2019-01-05: 20 mg via INTRAVENOUS

## 2019-01-05 MED ORDER — ROCURONIUM BROMIDE 50 MG/5ML IV SOLN
INTRAVENOUS | Status: AC
  Filled 2019-01-05: qty 1

## 2019-01-05 MED ORDER — LIDOCAINE HCL (CARDIAC) PF 100 MG/5ML IV SOSY
PREFILLED_SYRINGE | INTRAVENOUS | Status: DC | PRN
  Administered 2019-01-05: 80 mg via INTRAVENOUS

## 2019-01-05 MED ORDER — CEFAZOLIN SODIUM-DEXTROSE 1-4 GM/50ML-% IV SOLN
INTRAVENOUS | Status: DC | PRN
  Administered 2019-01-05: 2 g via INTRAVENOUS

## 2019-01-05 MED ORDER — PROMETHAZINE HCL 25 MG/ML IJ SOLN: 6.2500 mg | INTRAMUSCULAR | Status: DC | PRN

## 2019-01-05 MED ORDER — SODIUM CHLORIDE 0.9% FLUSH: 10.0000 mL | INTRAVENOUS | Status: DC | PRN

## 2019-01-05 MED ORDER — MEPERIDINE HCL 50 MG/ML IJ SOLN: 6.2500 mg | INTRAMUSCULAR | Status: DC | PRN

## 2019-01-05 MED ORDER — DEXMEDETOMIDINE HCL IN NACL 80 MCG/20ML IV SOLN
INTRAVENOUS | Status: AC
  Filled 2019-01-05: qty 20

## 2019-01-05 MED ORDER — TALC (STERITALC) POWDER FOR INTRAPLEURAL USE
INTRAPLEURAL | Status: AC
  Filled 2019-01-05: qty 4

## 2019-01-05 MED ORDER — FENTANYL CITRATE (PF) 100 MCG/2ML IJ SOLN
25.0000 ug | INTRAMUSCULAR | Status: DC | PRN
  Administered 2019-01-05 (×3): 50 ug via INTRAVENOUS

## 2019-01-05 MED ORDER — FENTANYL CITRATE (PF) 100 MCG/2ML IJ SOLN
INTRAMUSCULAR | Status: AC
  Filled 2019-01-05: qty 2

## 2019-01-05 MED ORDER — PHENYLEPHRINE HCL (PRESSORS) 10 MG/ML IV SOLN
INTRAVENOUS | Status: DC | PRN
  Administered 2019-01-05 (×3): 200 ug via INTRAVENOUS

## 2019-01-05 MED ORDER — FENTANYL CITRATE (PF) 100 MCG/2ML IJ SOLN
INTRAMUSCULAR | Status: AC
  Administered 2019-01-05: 16:00:00 50 ug via INTRAVENOUS
  Filled 2019-01-05: qty 2

## 2019-01-05 MED ORDER — ONDANSETRON HCL 4 MG/2ML IJ SOLN
INTRAMUSCULAR | Status: DC | PRN
  Administered 2019-01-05: 4 mg via INTRAVENOUS

## 2019-01-05 MED ORDER — CEFAZOLIN SODIUM-DEXTROSE 2-4 GM/100ML-% IV SOLN
2.0000 g | Freq: Three times a day (TID) | INTRAVENOUS | Status: AC
  Administered 2019-01-05 – 2019-01-06 (×2): 2 g via INTRAVENOUS
  Filled 2019-01-05 (×2): qty 100

## 2019-01-05 MED ORDER — SUGAMMADEX SODIUM 200 MG/2ML IV SOLN
INTRAVENOUS | Status: DC | PRN
  Administered 2019-01-05: 200 mg via INTRAVENOUS

## 2019-01-05 MED ORDER — BUPIVACAINE HCL (PF) 0.25 % IJ SOLN
INTRAMUSCULAR | Status: DC | PRN
  Administered 2019-01-05: 30 mL

## 2019-01-05 MED ORDER — ACETAMINOPHEN 10 MG/ML IV SOLN
INTRAVENOUS | Status: AC
  Filled 2019-01-05: qty 100

## 2019-01-05 MED ORDER — TRAMADOL HCL 50 MG PO TABS
50.0000 mg | ORAL_TABLET | Freq: Four times a day (QID) | ORAL | Status: DC
  Administered 2019-01-05: 100 mg via ORAL
  Administered 2019-01-05: 19:00:00 50 mg via ORAL
  Administered 2019-01-06: 05:00:00 100 mg via ORAL
  Filled 2019-01-05: qty 2
  Filled 2019-01-05: qty 1
  Filled 2019-01-05: qty 2

## 2019-01-05 MED ORDER — ACETAMINOPHEN 10 MG/ML IV SOLN
INTRAVENOUS | Status: DC | PRN
  Administered 2019-01-05: 1000 mg via INTRAVENOUS

## 2019-01-05 MED ORDER — ALBUTEROL SULFATE (2.5 MG/3ML) 0.083% IN NEBU
2.5000 mg | INHALATION_SOLUTION | RESPIRATORY_TRACT | Status: DC
  Administered 2019-01-05 – 2019-01-06 (×2): 2.5 mg via RESPIRATORY_TRACT
  Filled 2019-01-05 (×2): qty 3

## 2019-01-05 MED ORDER — CEFAZOLIN SODIUM-DEXTROSE 2-4 GM/100ML-% IV SOLN: 2.0000 g | INTRAVENOUS | Status: DC

## 2019-01-05 MED ORDER — MORPHINE SULFATE (PF) 2 MG/ML IV SOLN
1.0000 mg | INTRAVENOUS | Status: DC | PRN
  Administered 2019-01-05 (×4): 2 mg via INTRAVENOUS
  Filled 2019-01-05 (×5): qty 1

## 2019-01-05 MED ORDER — DEXTROSE-NACL 5-0.45 % IV SOLN
INTRAVENOUS | Status: DC
  Administered 2019-01-05 – 2019-01-06 (×2): via INTRAVENOUS

## 2019-01-05 MED ORDER — BUPIVACAINE HCL (PF) 0.25 % IJ SOLN
INTRAMUSCULAR | Status: AC
  Filled 2019-01-05: qty 30

## 2019-01-05 MED ORDER — ONDANSETRON HCL 4 MG/2ML IJ SOLN: 4.0000 mg | Freq: Four times a day (QID) | INTRAMUSCULAR | Status: DC | PRN

## 2019-01-05 MED ORDER — BISACODYL 5 MG PO TBEC: 10.0000 mg | DELAYED_RELEASE_TABLET | Freq: Every day | ORAL | Status: DC

## 2019-01-05 MED ORDER — EPHEDRINE SULFATE 50 MG/ML IJ SOLN
INTRAMUSCULAR | Status: DC | PRN
  Administered 2019-01-05 (×2): 5 mg via INTRAVENOUS
  Administered 2019-01-05: 10 mg via INTRAVENOUS
  Administered 2019-01-05 (×4): 5 mg via INTRAVENOUS

## 2019-01-05 MED ORDER — OXYCODONE HCL 5 MG/5ML PO SOLN: 5.0000 mg | Freq: Once | ORAL | Status: DC | PRN

## 2019-01-05 MED ORDER — PROPOFOL 10 MG/ML IV BOLUS
INTRAVENOUS | Status: DC | PRN
  Administered 2019-01-05: 200 mg via INTRAVENOUS

## 2019-01-05 MED ORDER — FENTANYL CITRATE (PF) 100 MCG/2ML IJ SOLN
INTRAMUSCULAR | Status: DC | PRN
  Administered 2019-01-05 (×4): 50 ug via INTRAVENOUS

## 2019-01-05 SURGICAL SUPPLY — 58 items
BNDG COHESIVE 4X5 TAN STRL (GAUZE/BANDAGES/DRESSINGS) ×1 IMPLANT
BRONCHOSCOPE PED SLIM DISP (MISCELLANEOUS) ×2 IMPLANT
CATH THOR STR 28F  SOFT WA (CATHETERS) ×1
CATH THOR STR 28F SOFT WA (CATHETERS) IMPLANT
CATH URET ROBINSON 16FR STRL (CATHETERS) IMPLANT
CHLORAPREP W/TINT 26 (MISCELLANEOUS) ×4 IMPLANT
CUTTER ECHEON FLEX ENDO 45 340 (ENDOMECHANICALS) ×2 IMPLANT
DEFOGGER SCOPE WARMER CLEARIFY (MISCELLANEOUS) ×2 IMPLANT
DRAIN CHEST DRY SUCT SGL (MISCELLANEOUS) ×2 IMPLANT
DRAPE C-SECTION (MISCELLANEOUS) ×2 IMPLANT
DRAPE MAG INST 16X20 L/F (DRAPES) ×1 IMPLANT
DRAPE POUCH INSTRU U-SHP 10X18 (DRAPES) ×1 IMPLANT
DRSG OPSITE POSTOP 3X4 (GAUZE/BANDAGES/DRESSINGS) ×3 IMPLANT
ELECT BLADE 6 FLAT ULTRCLN (ELECTRODE) ×1 IMPLANT
ELECT BLADE 6.5 EXT (BLADE) ×1 IMPLANT
ELECT CAUTERY BLADE TIP 2.5 (TIP) ×2
ELECT REM PT RETURN 9FT ADLT (ELECTROSURGICAL) ×2
ELECTRODE CAUTERY BLDE TIP 2.5 (TIP) ×1 IMPLANT
ELECTRODE REM PT RTRN 9FT ADLT (ELECTROSURGICAL) ×1 IMPLANT
GAUZE SPONGE 4X4 12PLY STRL (GAUZE/BANDAGES/DRESSINGS) ×2 IMPLANT
GLOVE SURG SYN 7.5  E (GLOVE) ×6
GLOVE SURG SYN 7.5 E (GLOVE) ×6 IMPLANT
GLOVE SURG SYN 7.5 PF PI (GLOVE) ×2 IMPLANT
GOWN STRL REUS W/ TWL LRG LVL3 (GOWN DISPOSABLE) ×4 IMPLANT
GOWN STRL REUS W/TWL LRG LVL3 (GOWN DISPOSABLE) ×3
KIT TURNOVER KIT A (KITS) ×2 IMPLANT
LABEL OR SOLS (LABEL) ×2 IMPLANT
LOOP RED MAXI  1X406MM (MISCELLANEOUS)
LOOP VESSEL MAXI 1X406 RED (MISCELLANEOUS) ×1 IMPLANT
MARKER SKIN DUAL TIP RULER LAB (MISCELLANEOUS) ×2 IMPLANT
NDL SPNL 20GX3.5 QUINCKE YW (NEEDLE) ×1 IMPLANT
NEEDLE SPNL 20GX3.5 QUINCKE YW (NEEDLE) ×2 IMPLANT
NS IRRIG 500ML POUR BTL (IV SOLUTION) ×1 IMPLANT
PACK BASIN MAJOR ARMC (MISCELLANEOUS) ×2 IMPLANT
RELOAD STAPLE 45 GOLD REG/THCK (STAPLE) IMPLANT
SCISSORS METZENBAUM CVD 33 (INSTRUMENTS) IMPLANT
SPONGE KITTNER 5P (MISCELLANEOUS) ×3 IMPLANT
STAPLE RELOAD 45MM GOLD (STAPLE) ×4 IMPLANT
STAPLER SKIN PROX 35W (STAPLE) ×1 IMPLANT
STAPLER VASCULAR ECHELON 35 (CUTTER) ×1 IMPLANT
SUT ETHILON 4-0 (SUTURE) ×1
SUT ETHILON 4-0 FS2 18XMFL BLK (SUTURE) ×1
SUT MNCRL AB 3-0 PS2 27 (SUTURE) ×2 IMPLANT
SUT SILK 1 SH (SUTURE) ×9 IMPLANT
SUT VIC AB 0 CT1 36 (SUTURE) ×4 IMPLANT
SUT VIC AB 2-0 CT1 27 (SUTURE) ×2
SUT VIC AB 2-0 CT1 TAPERPNT 27 (SUTURE) ×2 IMPLANT
SUT VIC AB 2-0 CT2 27 (SUTURE) ×4 IMPLANT
SUT VICRYL 2 TP 1 (SUTURE) ×4 IMPLANT
SUTURE ETHLN 4-0 FS2 18XMF BLK (SUTURE) IMPLANT
SYR BULB IRRIG 60ML STRL (SYRINGE) ×2 IMPLANT
TAPE CLOTH 3X10 WHT NS LF (GAUZE/BANDAGES/DRESSINGS) ×2 IMPLANT
TAPE TRANSPORE STRL 2 31045 (GAUZE/BANDAGES/DRESSINGS) ×2 IMPLANT
TRAY FOLEY MTR SLVR 16FR STAT (SET/KITS/TRAYS/PACK) ×2 IMPLANT
TROCAR FLEXIPATH 20X80 (ENDOMECHANICALS) ×1 IMPLANT
TROCAR FLEXIPATH THORACIC 15MM (ENDOMECHANICALS) ×1 IMPLANT
WATER STERILE IRR 1000ML POUR (IV SOLUTION) ×2 IMPLANT
YANKAUER SUCT BULB TIP FLEX NO (MISCELLANEOUS) ×2 IMPLANT

## 2019-01-05 NOTE — Anesthesia Procedure Notes (Signed)
Procedure Name: Intubation Date/Time: 01/05/2019 12:48 PM Performed by: Caryl Asp, CRNA Pre-anesthesia Checklist: Patient identified, Patient being monitored, Timeout performed, Emergency Drugs available and Suction available Patient Re-evaluated:Patient Re-evaluated prior to induction Oxygen Delivery Method: Circle system utilized Preoxygenation: Pre-oxygenation with 100% oxygen Induction Type: IV induction Ventilation: Mask ventilation without difficulty Laryngoscope Size: McGraph and 4 Grade View: Grade I Endobronchial tube: Left, Bronchial Blocker placed under direct vision and EBT position confirmed by fiberoptic bronchoscope and 39 Fr Tube size: 7.0 mm Number of attempts: 1 Airway Equipment and Method: Stylet Placement Confirmation: ETT inserted through vocal cords under direct vision,  positive ETCO2 and breath sounds checked- equal and bilateral Tube secured with: Tape Dental Injury: Teeth and Oropharynx as per pre-operative assessment

## 2019-01-05 NOTE — Anesthesia Post-op Follow-up Note (Signed)
Anesthesia QCDR form completed.        

## 2019-01-05 NOTE — Transfer of Care (Signed)
Immediate Anesthesia Transfer of Care Note  Patient: Zachary Lloyd  Procedure(s) Performed: VIDEO ASSISTED THORACOSCOPY (VATS)/WEDGE RESECTION (Right )  Patient Location: PACU  Anesthesia Type:General  Level of Consciousness: sedated  Airway & Oxygen Therapy: Patient Spontanous Breathing and Patient connected to face mask oxygen  Post-op Assessment: Report given to RN and Post -op Vital signs reviewed and stable  Post vital signs: Reviewed and stable  Last Vitals:  Vitals Value Taken Time  BP 116/76 01/05/19 1515  Temp    Pulse 80 01/05/19 1518  Resp 12 01/05/19 1518  SpO2 100 % 01/05/19 1518  Vitals shown include unvalidated device data.  Last Pain:  Vitals:   01/05/19 1159  TempSrc: Tympanic  PainSc: 4          Complications: No apparent anesthesia complications

## 2019-01-05 NOTE — Anesthesia Preprocedure Evaluation (Signed)
Anesthesia Evaluation  Patient identified by MRN, date of birth, ID band Patient awake    Reviewed: Allergy & Precautions, NPO status , Patient's Chart, lab work & pertinent test results  History of Anesthesia Complications Negative for: history of anesthetic complications  Airway Mallampati: II  TM Distance: >3 FB Neck ROM: Full    Dental no notable dental hx.    Pulmonary neg sleep apnea, neg COPD,  Spontaneous pneumothorax   breath sounds clear to auscultation- rhonchi (-) wheezing      Cardiovascular Exercise Tolerance: Good (-) hypertension(-) CAD and (-) Past MI  Rhythm:Regular Rate:Normal - Systolic murmurs and - Diastolic murmurs    Neuro/Psych negative neurological ROS  negative psych ROS   GI/Hepatic negative GI ROS, Neg liver ROS,   Endo/Other  negative endocrine ROSneg diabetes  Renal/GU negative Renal ROS     Musculoskeletal negative musculoskeletal ROS (+)   Abdominal (+) - obese,   Peds  Hematology negative hematology ROS (+)   Anesthesia Other Findings Past Medical History: No date: Spontaneous pneumothorax   Reproductive/Obstetrics                             Anesthesia Physical Anesthesia Plan  ASA: II  Anesthesia Plan: General   Post-op Pain Management:    Induction: Intravenous  PONV Risk Score and Plan: 1 and Ondansetron and Midazolam  Airway Management Planned: Oral ETT and Double Lumen EBT  Additional Equipment:   Intra-op Plan:   Post-operative Plan: Extubation in OR  Informed Consent: I have reviewed the patients History and Physical, chart, labs and discussed the procedure including the risks, benefits and alternatives for the proposed anesthesia with the patient or authorized representative who has indicated his/her understanding and acceptance.     Dental advisory given  Plan Discussed with: CRNA and Anesthesiologist  Anesthesia Plan  Comments:         Anesthesia Quick Evaluation

## 2019-01-05 NOTE — Op Note (Signed)
01/05/2019  4:00 PM  PATIENT:  Zachary Lloyd  22 y.o. male  PRE-OPERATIVE DIAGNOSIS: Recurrent spontaneous pneumothorax  POST-OPERATIVE DIAGNOSIS: Recurrent spontaneous pneumothorax  PROCEDURE: Right thoracoscopy with wedge resection right lung apex and mechanical pleurodesis  SURGEON:  Surgeon(s) and Role:    Hulda Marin, MD - Primary  ASSISTANTS: Darrelyn Hillock, PA  ANESTHESIA: General  INDICATIONS FOR PROCEDURE this patient is a 23 year old white male who had a right-sided pneumothorax about 10 months ago.  He experienced a recurrence yesterday and presented to our emergency department with a large right-sided pneumothorax.  A chest tube was inserted and he underwent CT scanning.  The CT scan did not reveal any obvious bleb disease but because this was recurrent in nature he was offered the above named procedure.  The indications and risks as well as the options were reviewed and he gave his informed consent.  DICTATION: The patient was brought to the operating suite and placed in the supine position.  General endotracheal anesthesia was given with a double-lumen tube.  Preoperative bronchoscopy was carried out by our anesthesiologist to determine that the tube was in the appropriate spot.  Patient was then rolled for a right thoracoscopy.  The previous chest tube was removed.  The patient was prepped and draped in usual sterile fashion and all pressure points were carefully padded.  We began by making a 15 mm port incision in the anterior axillary line at approximately the fifth intercostal space.  This was just below his prior chest tube site.  Once the chest was entered we then placed the scope and placed the other 2 ports under direct visualization.  One was placed posterior to the tip of the scapula and one inferiorly.  Through these 3 15 mm ports were able to complete the procedure.  Thoracoscopy was carried out.  There is no evidence of blebs at the lung apex or along the fissures.   There is no evidence of bleb disease in the superior segment of the lower lobe.  Because of the normal location of these blebs at the apex I then used an endoscopic stapler to resect the apex of the lung.  This was accomplished with 2 firings of a 45 mm stapler.  The staple line was hemostatic.  There were no other areas that were suspicious for any bleb disease.  We then performed mechanical pleurodesis using the Bovie scratch pad.  This was performed from the second rib downwards.  We could not reach the most inferior aspect of the chest with ease and we left the very apex over the subclavian vessels intact.  We then placed a 28 French straight chest tube to the apex of the chest and brought it out through our most inferior port site.  This was brought out through a separate skin incision inferior to the port site.  The wounds were then closed after irrigating the chest and ensuring hemostasis.  3 layers of Vicryl were used to close the muscle of the fascia and the skin.  The wounds were held together with Dermabond and sterile dressings were placed around the chest tube site and the old chest tube site.  At this point we had 1 additional needle and I elected to obtain a chest x-ray in the operating room.  We had the appropriate number of needles for cartridge packs but the count on the paper suggested one more.  The chest x-ray was done and was called back and read as negative for any foreign  body.  The patient was then awaken from general endotracheal anesthesia where he was extubated taken to the recovery room.     [name], MD

## 2019-01-05 NOTE — Progress Notes (Signed)
Patient came back to the floor after his procedure A&O.  Pacu nurse hooked up suction to regular, gave report, and returned to pacu.  Pt  States his pain is 8/10.  Pt requested a dose of morphine.  After pharmacy approved his meds, I went to medicate the patient but he was sleeping, then told me to come back in a bit. The morphine was returned for now.

## 2019-01-05 NOTE — Progress Notes (Signed)
Patient ID: Zachary Lloyd, male   DOB: 07-Mar-1996, 23 y.o.   MRN: [id number]  Chief Complaint  Patient presents with  . Pneumothorax    Referred By Dr. Carolan ShiverEdgardo Cintron Lloyd Reason for Referral recurrent pneumothorax right side  HPI Location, Quality, Duration, Severity, Timing, Context, Modifying Factors, Associated Signs and Symptoms.  Zachary Lloyd is a 23 y.o. male.  His problems began back in December of last year when he experienced a right-sided pneumothorax that was treated with a chest tube.  [place] [facility] [place]  He had the tube in place for a couple days and was ultimately discharged home.  Yesterday morning he began experiencing some tightness in his right chest associated with some pain and shortness of breath.  These are similar to his prior symptoms and he presented to the emergency department where chest x-ray was obtained.  The chest x-ray showed a large right-sided pneumothorax which was treated with chest tube.  The lung expanded on the chest x-ray and a subsequent CT scan was performed.  The CT scan did not reveal any obvious bleb disease on either the right or left sides.  The patient states he did have some discomfort from the chest tube but as far as his right-sided chest pain and shortness of breath that has now resolved.  The patient does not relate any specific episodes in which the pneumothorax occurred.  He was cutting grass yesterday.  The first time he was not doing anything of significant exercise or labor.  He does not smoke.  He does not have a family history of any pneumothorax or other lung disease.   Past Medical History:  Diagnosis Date  . Spontaneous pneumothorax     History reviewed. No pertinent surgical history.  No family history on file.  Social History Social History   Tobacco Use  . Smoking status: Never Smoker  . Smokeless tobacco: Never Used  Substance Use Topics  . Alcohol use: Yes  . Drug use: Never    No Known  Allergies  Current Facility-Administered Medications  Medication Dose Route Frequency Provider Last Rate Last Dose  . 0.9 %  sodium chloride infusion   Intravenous Continuous Zachary Shiverintron-Lloyd, Edgardo, MD 75 mL/hr at 01/05/19 0408    . acetaminophen (TYLENOL) tablet 650 mg  650 mg Oral Q6H PRN Zachary Shiverintron-Lloyd, Edgardo, MD       Or  . acetaminophen (TYLENOL) suppository 650 mg  650 mg Rectal Q6H PRN Zachary Shiverintron-Lloyd, Edgardo, MD      . enoxaparin (LOVENOX) injection 40 mg  40 mg Subcutaneous Q24H Cintron-Lloyd, Lurline IdolEdgardo, MD      . HYDROcodone-acetaminophen (NORCO/VICODIN) 5-325 MG per tablet 1-2 tablet  1-2 tablet Oral Q4H PRN Zachary Shiverintron-Lloyd, Edgardo, MD   2 tablet at 01/04/19 2044  . morphine 4 MG/ML injection 4 mg  4 mg Intravenous Q3H PRN Zachary Shiverintron-Lloyd, Edgardo, MD   4 mg at 01/05/19 0533  . ondansetron (ZOFRAN-ODT) disintegrating tablet 4 mg  4 mg Oral Q6H PRN Zachary Shiverintron-Lloyd, Edgardo, MD       Or  . ondansetron (ZOFRAN) injection 4 mg  4 mg Intravenous Q6H PRN Zachary Shiverintron-Lloyd, Edgardo, MD      . sodium chloride flush (NS) 0.9 % injection 10-40 mL  10-40 mL Intracatheter PRN Zachary Shiverintron-Lloyd, Edgardo, MD          Review of Systems A complete review of systems was asked and was negative except for the following positive findings chest pain and shortness of breath that resolved with chest  tube insertion  Blood pressure (!) 117/53, pulse 69, temperature (!) 97.5 F (36.4 C), temperature source Oral, resp. rate 20, height 5\' 11"  (1.803 m), weight 90.8 kg, SpO2 99 %.  Physical Exam CONSTITUTIONAL:  Pleasant, well-developed, well-nourished, and in no acute distress. EYES: Pupils equal and reactive to light, Sclera non-icteric EARS, NOSE, MOUTH AND THROAT:  The oropharynx was clear.  Dentition is good repair.  Oral mucosa pink and moist. LYMPH NODES:  Lymph nodes in the neck and axillae were normal RESPIRATORY:  Lungs were clear.  Normal respiratory effort without pathologic use of accessory muscles of respiration.   There was a right-sided chest tube in place.  There is no air leak. CARDIOVASCULAR: Heart was regular without murmurs.  There were no carotid bruits. GI: The abdomen was soft, nontender, and nondistended. There were no palpable masses. There was no hepatosplenomegaly. There were normal bowel sounds in all quadrants. GU:  Rectal deferred.   MUSCULOSKELETAL:  Normal muscle strength and tone.  No clubbing or cyanosis.   SKIN:  There were no pathologic skin lesions.  There were no nodules on palpation. NEUROLOGIC:  Sensation is normal.  Cranial nerves are grossly intact. PSYCH:  Oriented to person, place and time.  Mood and affect are normal.  Data Reviewed CT scan of the chest  I have personally reviewed the patient's imaging, laboratory findings and medical records.    Assessment    I have independently reviewed the patient's CT scan.  The CT scan does not reveal any obvious bleb disease.  There is no other significant findings on the chest CT to explain his recurrent pneumothorax.    Plan    I had a long discussion today with the patient and his father.  I reviewed with them the indications and risks of surgery.  Although there are no blebs present it is the current recommendation that after the second recurrent pneumothorax that he undergo apical resection followed by mechanical pleurodesis.  I explained to them the indications and risks including risks of bleeding, infection, air leak and recurrence rates.  I quoted him a 50% recurrence rate without surgery and a less than 5% recurrence rate with surgery after extensive discussion they would like to proceed.         Zachary Lewandowsky, MD 01/05/2019, 8:18 AM   Patient ID: Zachary Lloyd, male   DOB: April 06, 1996, 23 y.o.   MRN: [id number]

## 2019-01-06 LAB — HIV ANTIBODY (ROUTINE TESTING W REFLEX): HIV Screen 4th Generation wRfx: NONREACTIVE

## 2019-01-06 MED ORDER — OXYCODONE-ACETAMINOPHEN 7.5-325 MG PO TABS
1.0000 | ORAL_TABLET | Freq: Four times a day (QID) | ORAL | Status: DC | PRN
  Administered 2019-01-06: 23:00:00 2 via ORAL
  Administered 2019-01-06: 11:00:00 1 via ORAL
  Administered 2019-01-06: 17:00:00 2 via ORAL
  Administered 2019-01-06: 10:00:00 1 via ORAL
  Administered 2019-01-07 – 2019-01-10 (×14): 2 via ORAL
  Filled 2019-01-06 (×10): qty 2
  Filled 2019-01-06: qty 1
  Filled 2019-01-06 (×2): qty 2
  Filled 2019-01-06 (×2): qty 1
  Filled 2019-01-06 (×4): qty 2

## 2019-01-06 MED ORDER — DIPHENHYDRAMINE HCL 25 MG PO CAPS
25.0000 mg | ORAL_CAPSULE | ORAL | Status: DC | PRN
  Administered 2019-01-06 – 2019-01-07 (×2): 25 mg via ORAL
  Filled 2019-01-06 (×3): qty 1

## 2019-01-06 MED ORDER — TRAMADOL HCL 50 MG PO TABS
100.0000 mg | ORAL_TABLET | Freq: Four times a day (QID) | ORAL | Status: DC
  Administered 2019-01-06 – 2019-01-10 (×17): 100 mg via ORAL
  Filled 2019-01-06 (×18): qty 2

## 2019-01-06 MED ORDER — BISACODYL 5 MG PO TBEC
10.0000 mg | DELAYED_RELEASE_TABLET | Freq: Every day | ORAL | Status: DC
  Administered 2019-01-06 – 2019-01-10 (×5): 10 mg via ORAL
  Filled 2019-01-06 (×5): qty 2

## 2019-01-06 NOTE — Progress Notes (Signed)
Patient ID: Zachary Lloyd, male   DOB: [birth year], 23 y.o.   MRN: [id number]  Slept well.  Has some discomfort.  Ate well for breakfast.  Wounds clean and dry.  Lungs clear but decreased.  Heart regular  No air leak seen.  Minimal sersanguinous fluid from ches tube  Will encourage out of bed and ambulation. Discontinue IV fluids Discontinue IV narcotics Discontinue heart monitor.  Dr. Celine Ahr to cover in my absence  Marta Lamas

## 2019-01-06 NOTE — Plan of Care (Signed)
Patient doing well today.  Chest tube in place with no air leak.  Dressing C/D/I.  Incisions also C/D/I.  Patients pain is still there but being controlled with oral pain medication.  Patient got up and washed up and also walked a lap out in the hallway.  He has been encouraged to use the incentive spirometer.  No significant changes.

## 2019-01-07 ENCOUNTER — Inpatient Hospital Stay: Payer: BC Managed Care – PPO

## 2019-01-07 ENCOUNTER — Encounter: Payer: Self-pay | Admitting: Cardiothoracic Surgery

## 2019-01-07 MED ORDER — LORATADINE 10 MG PO TABS
10.0000 mg | ORAL_TABLET | Freq: Every day | ORAL | Status: DC
  Administered 2019-01-07 – 2019-01-09 (×3): 10 mg via ORAL
  Filled 2019-01-07 (×4): qty 1

## 2019-01-07 NOTE — Progress Notes (Signed)
This is a patient of Dr. Genevive Bi'. He underwent right thoracoscopy with wedge resection of the right lung apex and mechanical pleurodesis.  He has a chest tube in site.  This is postoperative day 2. Overnight, he experienced some itching.  He initially received Benadryl which did not relieve the itching; he received Claritin this morning with improvement.  His pain is improved.  He is breathing without difficulty.  Vitals:   01/06/19 1922 01/07/19 0504  BP: 126/72 (!) 97/47  Pulse: 97 81  Resp: 16 17  Temp: 98.1 F (36.7 C) 97.8 F (36.6 C)  SpO2: 95% 94%   Focused exam: He is alert and oriented x3.  Examination of his chest incisions demonstrate some mild bruising, but no erythema, induration, or drainage.  The chest tube site is clean.  There is no air leak.  Chest x-ray today shows no pneumothorax.  Impression and plan: This is a 23 year old man who had a recurrence of a spontaneous pneumothorax, now status post wedge resection and mechanical pleurodesis.  There is minimal output from his chest tube and there is no air leak.  We will place the tube to waterseal and check another film in about 4 hours.  Anticipate the tube will be able to be removed tomorrow with subsequent discharge.

## 2019-01-07 NOTE — Plan of Care (Signed)
Patient doing well today.  Dressing to the chest to is C/D/I.  Incisions look good.  MD changed the chest tube to water seal and patient tolerating that well.  Patient has been up ambulating multiple laps in the hallway.  Still no BM but patient receiving laxative and passing flatus.  No significant changes.

## 2019-01-08 ENCOUNTER — Inpatient Hospital Stay: Payer: BC Managed Care – PPO

## 2019-01-08 MED ORDER — POLYETHYLENE GLYCOL 3350 17 G PO PACK
17.0000 g | PACK | Freq: Every day | ORAL | Status: DC
  Administered 2019-01-08: 13:00:00 17 g via ORAL
  Filled 2019-01-08 (×2): qty 1

## 2019-01-08 MED ORDER — POLYETHYLENE GLYCOL 3350 17 G PO PACK
17.0000 g | PACK | Freq: Once | ORAL | Status: DC
  Filled 2019-01-08: qty 1

## 2019-01-08 NOTE — Progress Notes (Addendum)
[place] Hospital Day(s): 4.   Post op day(s): 3 Days Post-Op.   Interval History: Patient seen and examined, no acute events or new complaints overnight. Patient reports that he is feeling "okay." No chest pain or increase in SOB. Chest tube with ~100 ccs out in last 24 hours. No air leak appreciable. He has been on water seal. Repeat CXR did show small medial right pneumothorax. Still with some itching which is improving with Claritin plus changing his sheets. No other acute issues.   Vital signs in last 24 hours: [min-max] current  Temp:  [98 F (36.7 C)-98.5 F (36.9 C)] 98 F (36.7 C) (08/31 0429) Pulse Rate:  [59-74] 59 (08/31 0429) Resp:  [14-20] 18 (08/31 0429) BP: (97-120)/(55-66) 97/55 (08/31 0429) SpO2:  [94 %-98 %] 94 % (08/31 0429)     Height: 5\' 11"  (180.3 cm) Weight: 90.8 kg BMI (Calculated): 27.93   Intake/Output last 2 shifts:  08/30 0701 - 08/31 0700 In: 480 [P.O.:480] Out: 1410 [Urine:1300; Chest Tube:110]   Physical Exam:  Constitutional: alert, cooperative and no distress  Respiratory: breathing non-labored at rest, good breath sounds  Cardiovascular: regular rate and sinus rhythm  Integumentary: Right chest wall incisions and chest tube site, CDI, no erythema or drainage.   Labs:  CBC Latest Ref Rng & Units 01/04/2019  WBC 4.0 - 10.5 K/uL 11.8(H)  Hemoglobin 13.0 - 17.0 g/dL 16.6  Hematocrit 39.0 - 52.0 % 46.8  Platelets 150 - 400 K/uL 231   CMP Latest Ref Rng & Units 01/04/2019  Glucose 70 - 99 mg/dL 95  BUN 6 - 20 mg/dL 16  Creatinine 0.61 - 1.24 mg/dL 0.92  Sodium 135 - 145 mmol/L 139  Potassium 3.5 - 5.1 mmol/L 3.9  Chloride 98 - 111 mmol/L 101  CO2 22 - 32 mmol/L 29  Calcium 8.9 - 10.3 mg/dL 10.0     Imaging studies:   CXR (01/08/2019) personally reviewed which does show right medial pneumothorax with chest tube on water seal, and radiologist report also reviewed:  IMPRESSION: Small residual  medial right pneumothorax, with right chest tube in place. No other acute findings.   Assessment/Plan:  23 y.o. male with small right medial opnemothorax with chest tube on water seal 3 Days Post-Op s/p right VATs with apical wedge resection and mechanical pleurodesis for recurrent pneumothorax.   - Will place chest tube back to - 20 cm of suction  - Repeat CXR in AM  - pain control prn  - Continue Claritin for itching  - diet as tolerates   - mobilization encouraged  - incentive spirometer   All of the above findings and recommendations were discussed with the patient, and the medical team, and all of patient's questions were answered to his expressed satisfaction.  -- Edison Simon, PA-C [facility] 01/08/2019, 10:03 AM [phone] M-F: 7am - 4pm   I saw and evaluated the patient.  I agree with the above documentation, exam, and plan, which I have edited where appropriate. Fredirick Maudlin  10:33 AM

## 2019-01-08 NOTE — Plan of Care (Signed)
Chest drain output being monitored. Chest tube placed to wall suction by PA this morning when rounding with surgeon.  Problem: Education: Goal: Knowledge of General Education information will improve Description: Including pain rating scale, medication(s)/side effects and non-pharmacologic comfort measures Outcome: Progressing   Problem: Health Behavior/Discharge Planning: Goal: Ability to manage health-related needs will improve Outcome: Progressing   Problem: Clinical Measurements: Goal: Ability to maintain clinical measurements within normal limits will improve Outcome: Progressing Goal: Will remain free from infection Outcome: Progressing Goal: Diagnostic test results will improve Outcome: Progressing Goal: Respiratory complications will improve Outcome: Progressing Goal: Cardiovascular complication will be avoided Outcome: Progressing   Problem: Activity: Goal: Risk for activity intolerance will decrease Outcome: Progressing   Problem: Nutrition: Goal: Adequate nutrition will be maintained Outcome: Progressing   Problem: Elimination: Goal: Will not experience complications related to bowel motility Outcome: Progressing Goal: Will not experience complications related to urinary retention Outcome: Progressing

## 2019-01-08 NOTE — Progress Notes (Signed)
Patient reported feeling dizzy suddenly yet vital signs were obtained and were stable.

## 2019-01-09 ENCOUNTER — Inpatient Hospital Stay: Payer: BC Managed Care – PPO

## 2019-01-09 LAB — SURGICAL PATHOLOGY

## 2019-01-09 MED ORDER — MUPIROCIN 2 % EX OINT
1.0000 "application " | TOPICAL_OINTMENT | Freq: Two times a day (BID) | CUTANEOUS | Status: DC
  Administered 2019-01-10: 1 via NASAL
  Filled 2019-01-09: qty 22

## 2019-01-09 MED ORDER — BISACODYL 10 MG RE SUPP
10.0000 mg | Freq: Every day | RECTAL | Status: DC | PRN
  Administered 2019-01-09 – 2019-01-10 (×2): 10 mg via RECTAL
  Filled 2019-01-09 (×2): qty 1

## 2019-01-09 MED ORDER — ONDANSETRON HCL 4 MG/2ML IJ SOLN: 4.0000 mg | Freq: Four times a day (QID) | INTRAMUSCULAR | Status: DC | PRN

## 2019-01-09 MED ORDER — CHLORHEXIDINE GLUCONATE CLOTH 2 % EX PADS: 6.0000 | MEDICATED_PAD | Freq: Every day | CUTANEOUS | Status: DC

## 2019-01-09 NOTE — Plan of Care (Signed)
Patient doing ok today.  CXR still showed a small pneumothorax but patient breathing ok.  MD changed CT to water seal.  Dressing and incisions C/D/I.  Patient has felt bloated and sick on his stomach today but he has not had a BM since Thursday.  Suppository was given today with some results.  Ambulating in the hallway.  No significant changes.

## 2019-01-09 NOTE — Progress Notes (Signed)
[place] Hospital Day(s): 5.   Post op day(s): 4 Days Post-Op.   Interval History: Patient seen and examined, no acute events or new complaints overnight. Patient reports that he is doing well. He had an issue with light headedness this morning but that has resolved. No fever, chills, or SOB. Chest tube with only 50 ccs out yesterday. No air leak. Repeat CXR with small apical and medical PTX.    Vital signs in last 24 hours: [min-max] current  Temp:  [97.6 F (36.4 C)-98.1 F (36.7 C)] 97.8 F (36.6 C) (09/01 1152) Pulse Rate:  [63-73] 70 (09/01 1152) Resp:  [15-18] 18 (09/01 1152) BP: (103-130)/(64-77) 103/64 (09/01 1152) SpO2:  [95 %-99 %] 99 % (09/01 1152)     Height: 5\' 11"  (180.3 cm) Weight: 90.8 kg BMI (Calculated): 27.93   Intake/Output last 2 shifts:  08/31 0701 - 09/01 0700 In: -  Out: 1200 [Urine:1150; Chest Tube:50]   Physical Exam:  Constitutional: alert, cooperative and no distress  Respiratory: breathing non-labored at rest, good breath sounds  Cardiovascular: regular rate and sinus rhythm  Integumentary: Right chest wall incisions and chest tube site, CDI, no erythema or drainage.    Labs:  CBC Latest Ref Rng & Units 01/04/2019  WBC 4.0 - 10.5 K/uL 11.8(H)  Hemoglobin 13.0 - 17.0 g/dL 16.6  Hematocrit 39.0 - 52.0 % 46.8  Platelets 150 - 400 K/uL 231   CMP Latest Ref Rng & Units 01/04/2019  Glucose 70 - 99 mg/dL 95  BUN 6 - 20 mg/dL 16  Creatinine 0.61 - 1.24 mg/dL 0.92  Sodium 135 - 145 mmol/L 139  Potassium 3.5 - 5.1 mmol/L 3.9  Chloride 98 - 111 mmol/L 101  CO2 22 - 32 mmol/L 29  Calcium 8.9 - 10.3 mg/dL 10.0     Imaging studies:   CXR (08901/2020) personally reviewed with small right apical PTX and radiologist report reviewed:  IMPRESSION: Stable position of the RIGHT-sided chest tube. Tiny residual pneumothorax at the RIGHT lung apex and along the RIGHT heart/mediastinal border.  Assessment/Plan:   23 y.o. male with doing well with improvement in PTX 4 Days Post-Op s/p right VATs with apical wedge resection and mechanical pleurodesis for recurrent pneumothorax.   - Chest tube to water seal  - Repeat CXR in AM  - pain control prn             - Continue Claritin for itching             - diet as tolerates              - mobilization encouraged             - incentive spirometer   All of the above findings and recommendations were discussed with the patient, patient's family, and the medical team, and all of patient's and family's questions were answered to their expressed satisfaction.  -- Edison Simon, PA-C [facility] 01/09/2019, 1:09 PM [phone] M-F: 7am - 4pm

## 2019-01-09 NOTE — Progress Notes (Signed)
Radiology called this RN and stated that patient was feeling, "...light headed, color didn't look good and feeling faint; was lying down on Xray table"; asked Radiology tech if they had taken any VS; was told "no, they didn't have any equipment for that..."; Found patient lying on Xray table, flushed in face; VS: BP 130/71; pulse 64; pulse Oximetry 95% RA; patient stated that he was beginning to feel better. Chest tube without leaking noted and in negative pressure off suction. Returned to room 201. Barbaraann Faster, RN 6:56 AM 01/09/2019

## 2019-01-10 ENCOUNTER — Inpatient Hospital Stay: Payer: BC Managed Care – PPO

## 2019-01-10 MED ORDER — OXYCODONE-ACETAMINOPHEN 7.5-325 MG PO TABS: 1.0000 | ORAL_TABLET | Freq: Four times a day (QID) | ORAL | 0 refills | Status: DC | PRN

## 2019-01-10 MED ORDER — MORPHINE SULFATE (PF) 2 MG/ML IV SOLN
INTRAVENOUS | Status: AC
  Filled 2019-01-10: qty 1

## 2019-01-10 MED ORDER — MORPHINE SULFATE (PF) 2 MG/ML IV SOLN
2.0000 mg | Freq: Once | INTRAVENOUS | Status: AC
  Administered 2019-01-10: 15:00:00 2 mg via INTRAVENOUS

## 2019-01-10 MED ORDER — MAGNESIUM HYDROXIDE 400 MG/5ML PO SUSP
30.0000 mL | Freq: Every day | ORAL | Status: DC | PRN
  Administered 2019-01-10: 09:00:00 30 mL via ORAL
  Filled 2019-01-10: qty 30

## 2019-01-10 MED ORDER — METHOCARBAMOL 500 MG PO TABS: 500.0000 mg | ORAL_TABLET | Freq: Four times a day (QID) | ORAL | 0 refills | Status: AC | PRN

## 2019-01-10 MED ORDER — IBUPROFEN 800 MG PO TABS: 800.0000 mg | ORAL_TABLET | Freq: Three times a day (TID) | ORAL | 0 refills | Status: AC | PRN

## 2019-01-10 MED ORDER — METHOCARBAMOL 500 MG PO TABS
500.0000 mg | ORAL_TABLET | Freq: Four times a day (QID) | ORAL | Status: DC | PRN
  Administered 2019-01-10: 500 mg via ORAL
  Filled 2019-01-10 (×3): qty 1

## 2019-01-10 MED ORDER — KETOROLAC TROMETHAMINE 30 MG/ML IJ SOLN
30.0000 mg | Freq: Four times a day (QID) | INTRAMUSCULAR | Status: DC | PRN
  Administered 2019-01-10: 13:00:00 30 mg via INTRAVENOUS
  Filled 2019-01-10: qty 1

## 2019-01-10 NOTE — Progress Notes (Signed)
Order received from Edison Simon for milk of magnesia

## 2019-01-10 NOTE — Discharge Summary (Addendum)
[facility] SURGICAL DISCHARGE SUMMARY  Patient ID: Zachary Lloyd MRN: [id number] DOB/AGE: 12/23/1995 23 y.o.  Admit date: 01/04/2019 Discharge date: 01/10/2019  Discharge Diagnoses Patient Active Problem List   Diagnosis Date Noted  . 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 male presented to [facility] ED for evaluation of chest pain. Patient reports started having chest pain while working today. Pain on right side of the chest. No pain radiation. No alleviating or aggravating factor. Pain associated with shortness of breath. This was a similar pain as last year when he was diagnosed with pneumothorax. Patient was treated with chest tube and resolved. Denies any trauma. Denies smoking. Chest xray was done today showing a moderate size right pneumothorax. Chest tube placed by ED physician. New chest xray shows lung re expansion.   Hospital Course: Patient was admitted to general surgery and underwent Right thoracoscopy with wedge resection right lung apex and mechanical pleurodesis on 08/28 with Dr Genevive Bi. He did well post-operatively. His pain was well controlled. His PTX resolved and did not have any evidence of air leak. He had small medial and apical right pneumothorax on follow up CXR but this resolved on POD5. On 09/02, his CXR did not reveal any pneumothorax and he was without air leak. His chest tube was removed on this day and occlusive dressing placed. Follow up CXR was without concern and he was discharged home. The remainder of patient's hospital course was essentially unremarkable, and discharge planning was initiated accordingly with patient safely able to be discharged home with appropriate discharge instructions, pain control, and outpatient follow-up after all of his and family's questions were answered to their expressed  satisfaction.   Discharge Condition: Good   Physical Examination:  Constitutional: Well appearing male, NAD Pulmonary: Normal effort, good breath sounds throughout all fields, no respiratory distress Chest: VATS and chest tube sites are CDI, no erythema, no drainage Skin: Warm, dry   Allergies as of 01/10/2019   No Known Allergies     Medication List    TAKE these medications   ibuprofen 800 MG tablet Commonly known as: ADVIL Take 1 tablet (800 mg total) by mouth every 8 (eight) hours as needed.   methocarbamol 500 MG tablet Commonly known as: ROBAXIN Take 1 tablet (500 mg total) by mouth every 6 (six) hours as needed for muscle spasms.   oxyCODONE-acetaminophen 7.5-325 MG tablet Commonly known as: PERCOCET Take 1 tablet by mouth every 6 (six) hours as needed for moderate pain or severe pain (Breakthrough pain).        Follow-up Information    [name], MD. Schedule an appointment as soon as possible for a visit in 1 week(s).   Specialties: Cardiothoracic Surgery, General Surgery Why: follow up with Dr Genevive Bi early next week if possible, recurrent PTX s/p VATS Contact information: [address] [place] [address] [phone]            Time spent on discharge management including discussion of hospital course, clinical condition, outpatient instructions, prescriptions, and follow up with the patient and members of the medical team: >30 minutes  -- Edison Simon , PA-C [facility]  01/10/2019, 3:19 PM [phone] M-F: 7am - 4pm

## 2019-01-10 NOTE — Progress Notes (Addendum)
Spoke with Dr Dahlia Byes. Patient's CXray is okay- no pneumothorax. Dr Dahlia Byes said patient is able to be discharged. Nurse explained and gave discharge paperwork to patient and his girlfriend who is going to drive pt home.Pt left with his belongings accompanied by staff and his girlfriend. IV had been removed without difficulty.

## 2019-01-16 NOTE — Anesthesia Postprocedure Evaluation (Signed)
Anesthesia Post Note  Patient: Zachary Lloyd  Procedure(s) Performed: VIDEO ASSISTED THORACOSCOPY (VATS)/WEDGE RESECTION (Right )  Patient location during evaluation: PACU Anesthesia Type: General Level of consciousness: awake and alert and oriented Pain management: pain level controlled Vital Signs Assessment: post-procedure vital signs reviewed and stable Respiratory status: spontaneous breathing, nonlabored ventilation and respiratory function stable Cardiovascular status: blood pressure returned to baseline and stable Postop Assessment: no signs of nausea or vomiting Anesthetic complications: no     Last Vitals:  Vitals:   01/10/19 0810 01/10/19 1140  BP:  106/60  Pulse:  72  Resp:    Temp:  36.5 C  SpO2: 99% 98%    Last Pain:  Vitals:   01/10/19 1645  TempSrc:   PainSc: 3                  Quentina Fronek

## 2019-01-19 ENCOUNTER — Encounter: Payer: Self-pay | Admitting: Cardiothoracic Surgery

## 2019-01-19 ENCOUNTER — Ambulatory Visit
Admission: RE | Admit: 2019-01-19 | Discharge: 2019-01-19 | Disposition: A | Discharge status: Home / Self Care | Payer: BC Managed Care – PPO | Source: Ambulatory Visit | Attending: Physician Assistant | Admitting: Physician Assistant

## 2019-01-19 ENCOUNTER — Ambulatory Visit (INDEPENDENT_AMBULATORY_CARE_PROVIDER_SITE_OTHER): Payer: BC Managed Care – PPO | Admitting: Cardiothoracic Surgery

## 2019-01-19 ENCOUNTER — Ambulatory Visit
Admission: RE | Admit: 2019-01-19 | Discharge: 2019-01-19 | Disposition: A | Discharge status: Home / Self Care | Payer: BC Managed Care – PPO | Attending: Cardiothoracic Surgery | Admitting: Cardiothoracic Surgery

## 2019-01-19 ENCOUNTER — Other Ambulatory Visit: Payer: Self-pay

## 2019-01-19 VITALS — BP 113/73 | HR 112 | Temp 97.5°F | Ht 71.0 in | Wt 190.8 lb

## 2019-01-19 DIAGNOSIS — J9383 Other pneumothorax: Secondary | ICD-10-CM

## 2019-01-19 NOTE — Progress Notes (Signed)
Mr. Zachary Lloyd returns today in follow-up.  He is not short of breath.  He has some discomfort on his right chest but has not required any narcotics for the last 2 days.  His thoracoscopy sites are well-healed.  His lungs are clear and equal bilaterally.  His heart is regular.  He did have a chest x-ray made today which I have independently reviewed.  There is no evidence of pneumothorax or pleural effusion.  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  There was no evidence of malignancy infection or granulomatous disease.  I would like to see him back again in 4 weeks time with another chest x-ray.  Discharge instructions were given.  If all is well in 4 weeks he will be discharged from our care to continue his routine activities.

## 2019-01-19 NOTE — Patient Instructions (Signed)
You may drive now. No heavy lifting for 2 more weeks.   Gradually increase you activity.   Clean areas with soap and water.   Follow up here in 4 weeks with a chest x-ray prior.

## 2019-02-23 ENCOUNTER — Ambulatory Visit
Admission: RE | Admit: 2019-02-23 | Discharge: 2019-02-23 | Disposition: A | Discharge status: Home / Self Care | Payer: BC Managed Care – PPO | Attending: Cardiothoracic Surgery | Admitting: Cardiothoracic Surgery

## 2019-02-23 ENCOUNTER — Ambulatory Visit (INDEPENDENT_AMBULATORY_CARE_PROVIDER_SITE_OTHER): Payer: BC Managed Care – PPO | Admitting: Cardiothoracic Surgery

## 2019-02-23 ENCOUNTER — Encounter: Payer: Self-pay | Admitting: Cardiothoracic Surgery

## 2019-02-23 ENCOUNTER — Ambulatory Visit
Admission: RE | Admit: 2019-02-23 | Discharge: 2019-02-23 | Disposition: A | Discharge status: Home / Self Care | Payer: BC Managed Care – PPO | Source: Ambulatory Visit | Attending: Cardiothoracic Surgery | Admitting: Cardiothoracic Surgery

## 2019-02-23 ENCOUNTER — Encounter: Payer: BC Managed Care – PPO | Admitting: Cardiothoracic Surgery

## 2019-02-23 ENCOUNTER — Other Ambulatory Visit: Payer: Self-pay

## 2019-02-23 VITALS — BP 118/81 | HR 84 | Temp 97.9°F | Ht 71.0 in | Wt 204.8 lb

## 2019-02-23 DIAGNOSIS — J9383 Other pneumothorax: Secondary | ICD-10-CM | POA: Diagnosis present

## 2019-02-23 NOTE — Patient Instructions (Addendum)
We will refer you to Dr Zetta Bills at [facility]. They will contact you to schedule this appointment. Call us if you do not hear from them in a week.   You may continue to increase you activities.  Follow-up with our office as needed.  Please call and ask to speak with a nurse if you develop questions or concerns.

## 2019-02-23 NOTE — Progress Notes (Signed)
Mr. Zachary Lloyd returns today in follow-up.  He is now about 7 weeks out from his right thoracoscopy and blebectomy with mechanical pleurodesis.  Overall he is done pretty well.  Last week he states he had some discomfort at the apex of his chest but this week it has abated.  On exam his thoracoscopy wounds are well-healed.  His lungs are clear bilaterally.  His heart is regular.  I did get a chest x-ray today.  I see no evidence of pleural effusion or pneumothorax on that.  I had a long discussion with him and his father today.  I gave him a copy of the pathology report.  I told him that there were some genetic causes of emphysema although they were quite rare.  I would like him to follow-up with Dr. Lanney Gins  in pulmonary medicine at the [facility] clinic in follow-up.  I have previously spoken with Dr. Lanney Gins and he is in agreement.  He will follow-up with [place] on an as-needed basis.

## 2019-02-26 NOTE — Progress Notes (Signed)
Referral has been faxed to Dr Lanney Gins at [facility]: [phone] Fax: [phone]

## 2020-06-24 IMAGING — CR DG CHEST 2V
2 series · 2 of 2 positions shown · non-contrast
Comparison: 01/09/2019

CLINICAL DATA: Right pneumothorax.

EXAM:
CHEST - 2 VIEW

[chest pa]
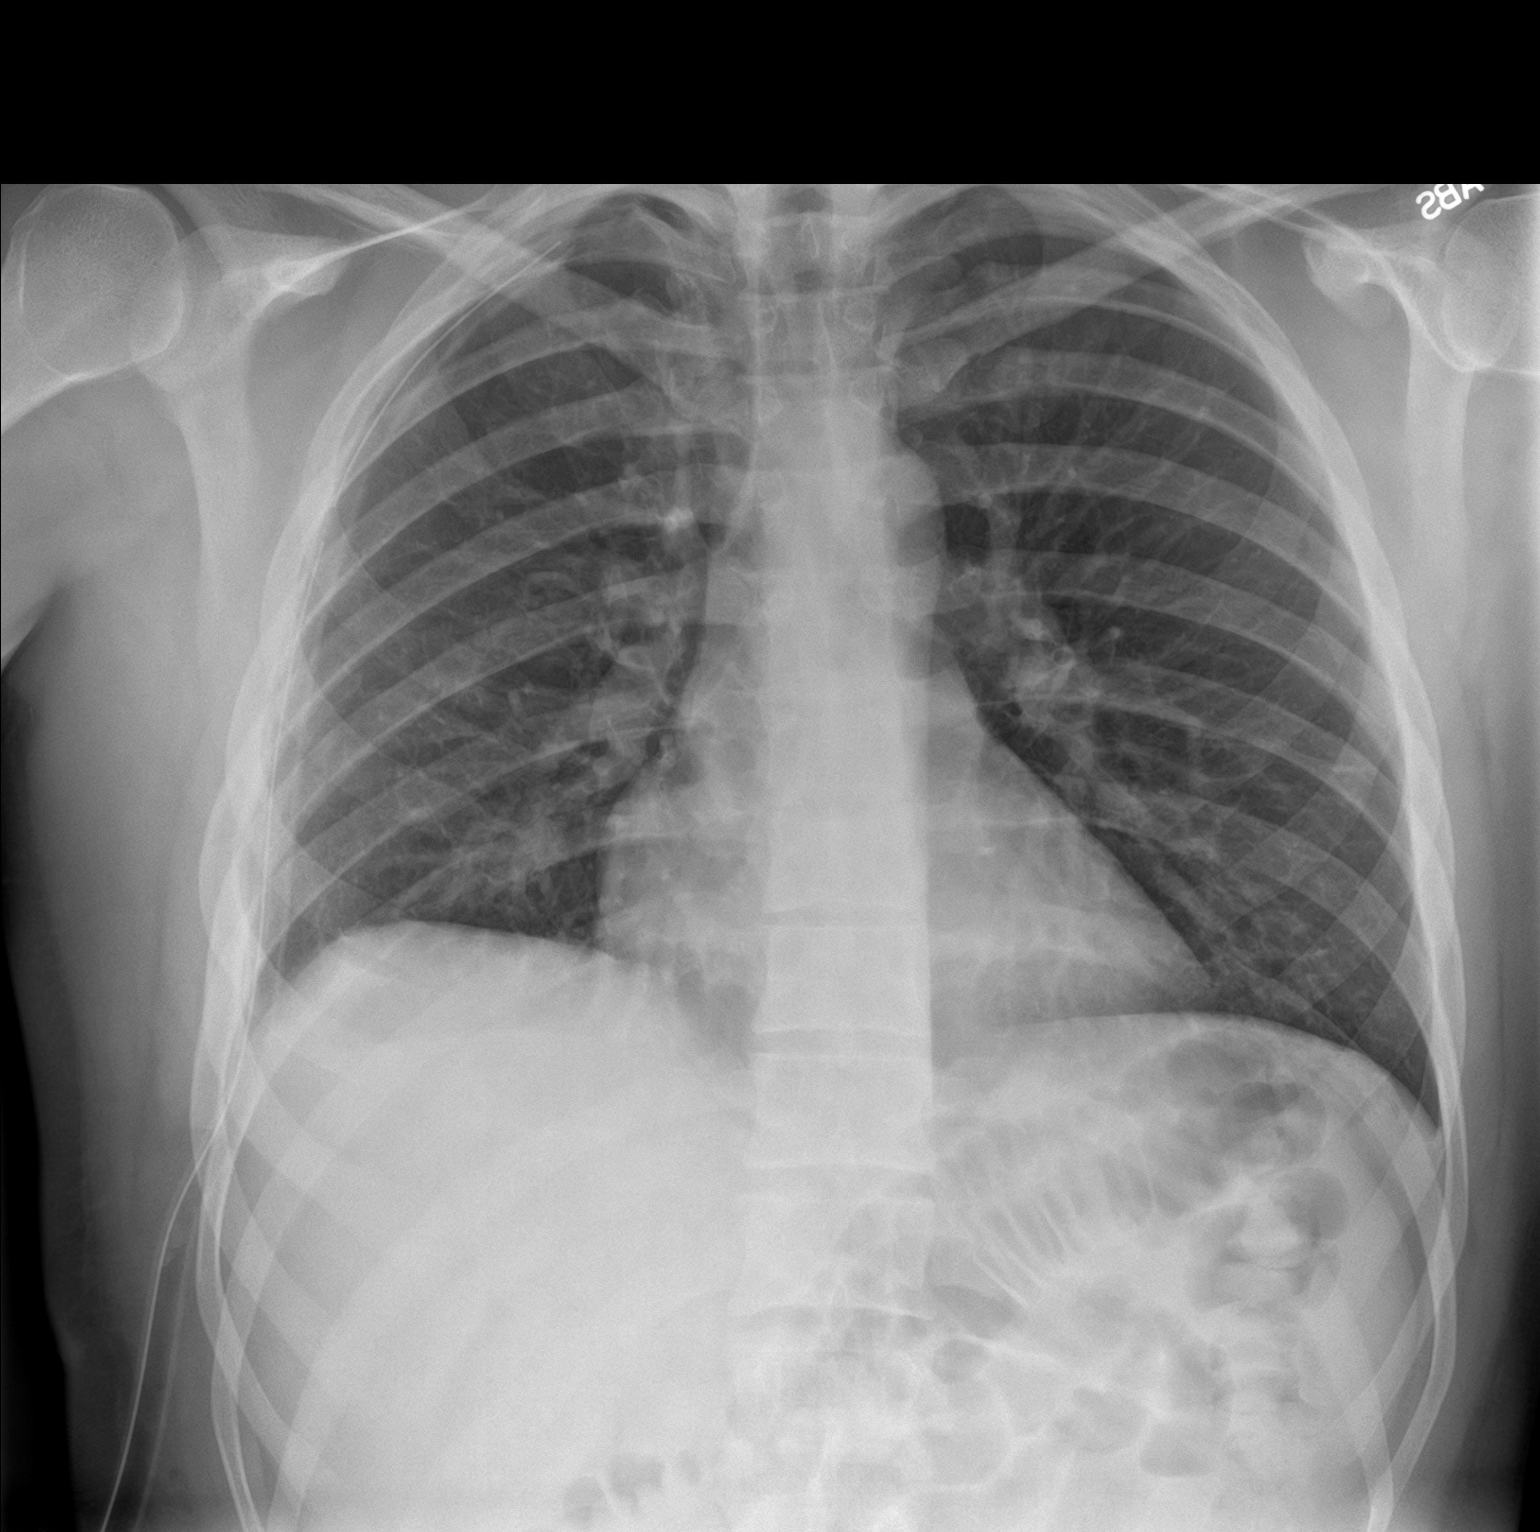

[chest lat]
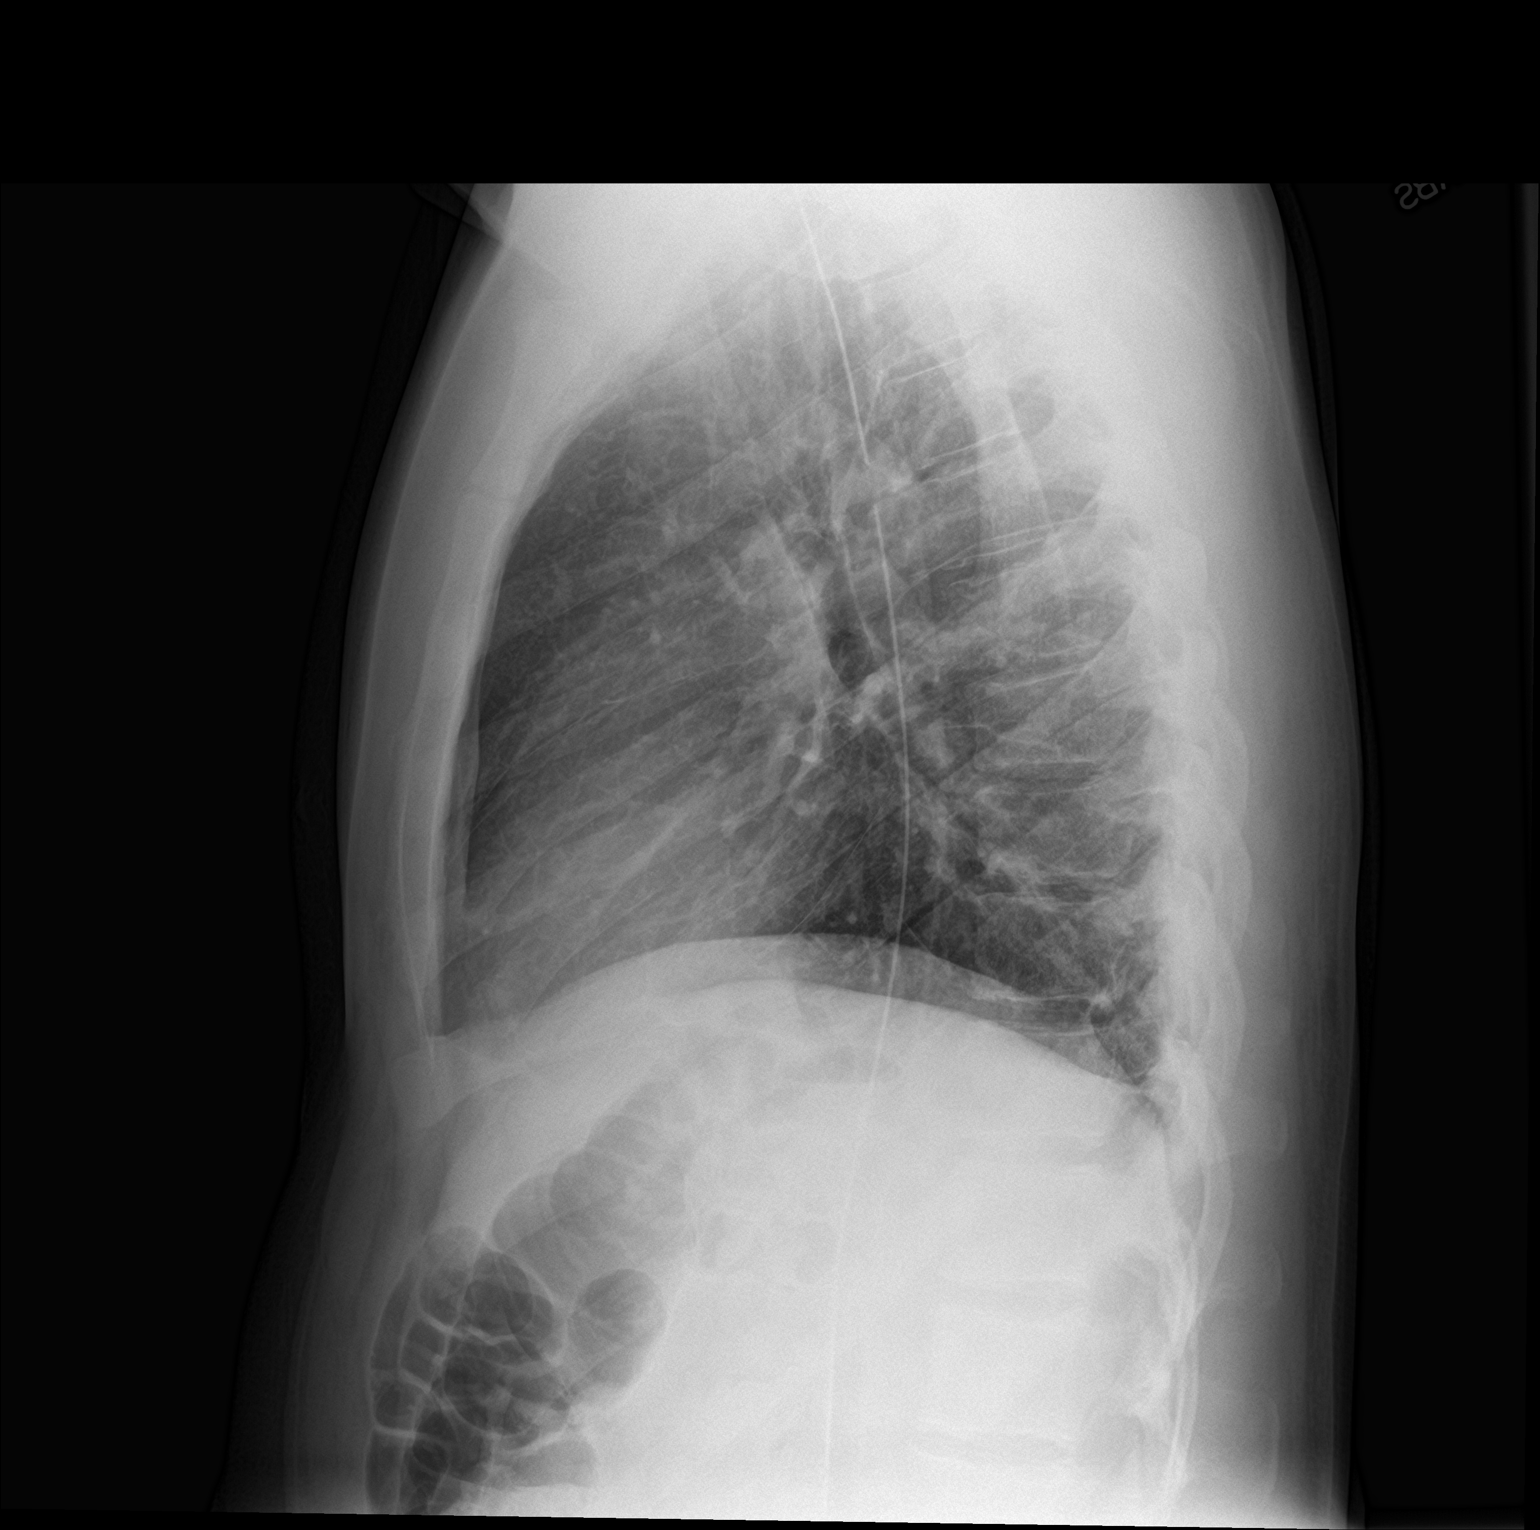

[2 of 2 positions shown; findings below may reference images not displayed]

FINDINGS: Right chest tube remains in place, unchanged. No pneumothorax.
Staple line at the right lung apex. Lungs are otherwise clear. Heart
size and vascularity are normal. No bone abnormality.
IMPRESSION: No residual pneumothorax.

## 2020-06-24 IMAGING — DX DG CHEST 1V PORT
1 series · 1 of 1 positions shown · non-contrast
Comparison: Earlier same day.

CLINICAL DATA: Status post VATS.  Chest tube removal.

EXAM:
PORTABLE CHEST 1 VIEW

[chest ap]
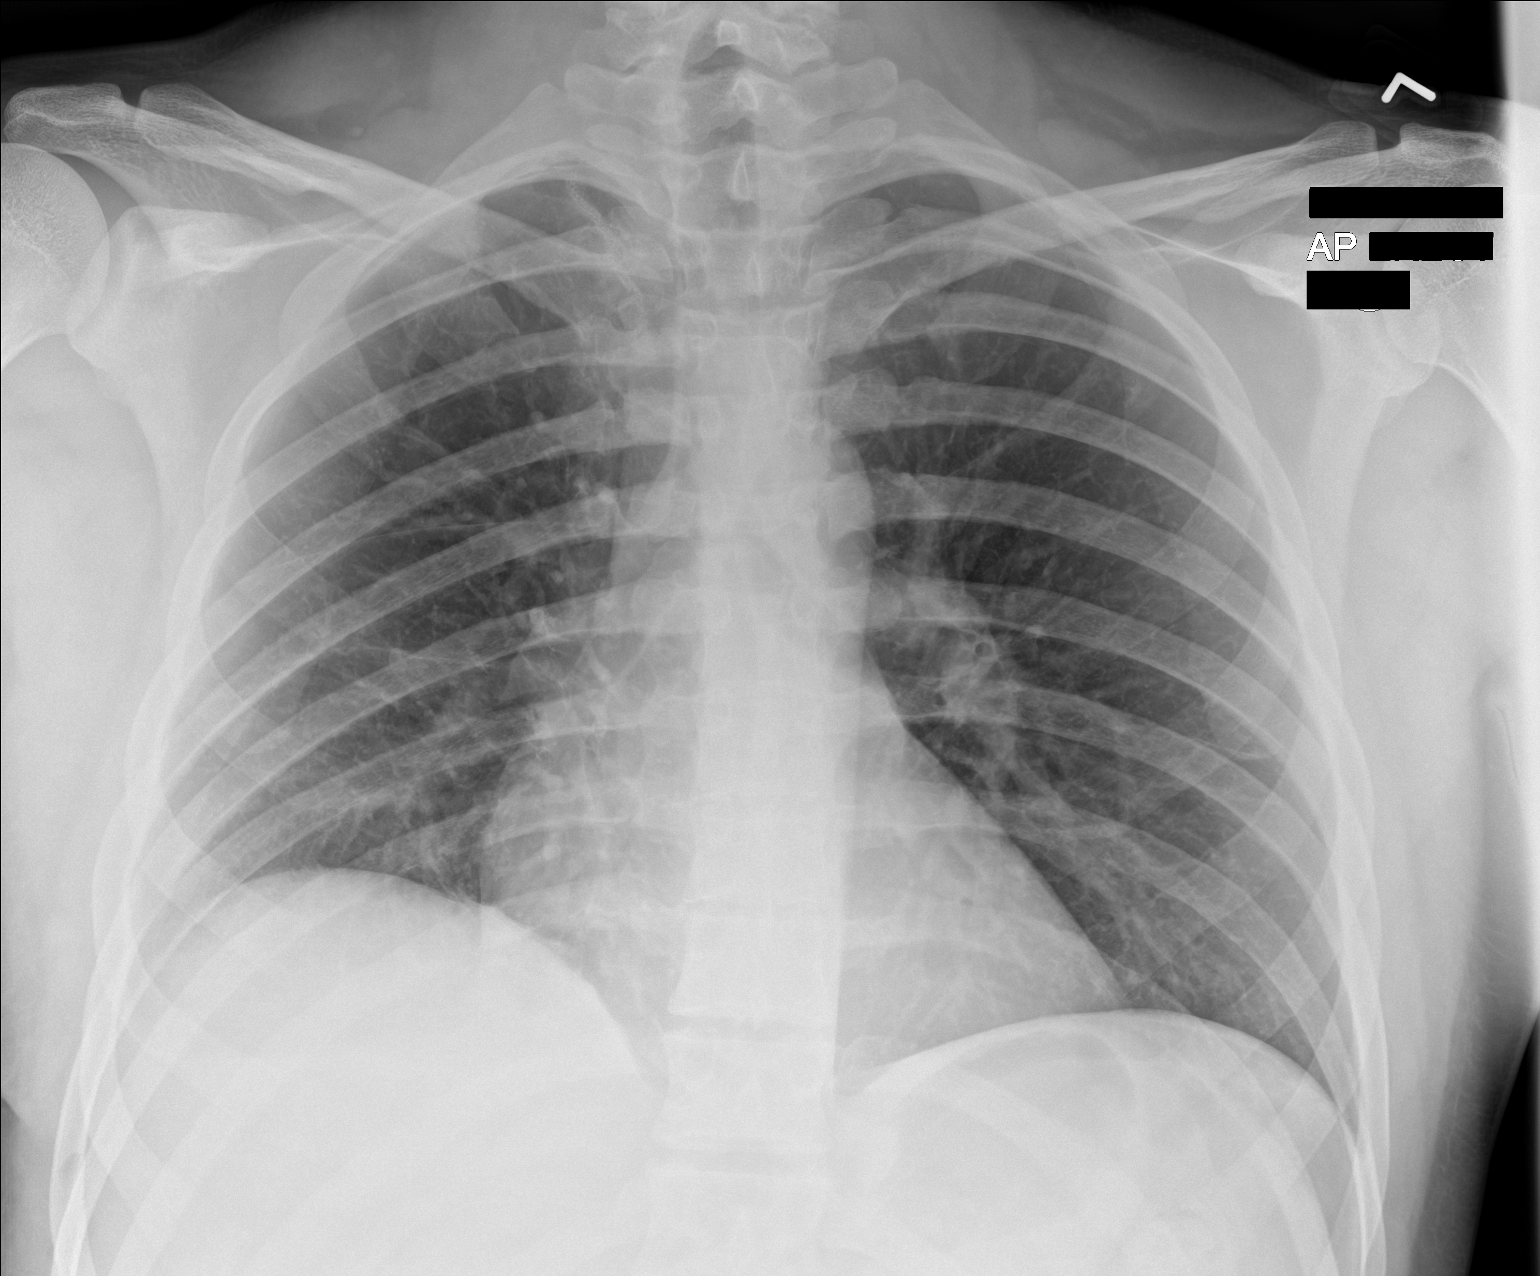

[1 of 1 positions shown; findings below may reference images not displayed]

FINDINGS: 5500 hours. Staple line noted right apex. Right chest tube is been
removed in the interval. Mild elevation right hemidiaphragm noted.
No pneumothorax or pleural effusion. No pulmonary edema or focal
airspace consolidation. The cardiopericardial silhouette is within
normal limits for size. The visualized bony structures of the thorax
are intact.
IMPRESSION: Interval right chest tube removal without evidence for pneumothorax
or pleural effusion.

## 2020-10-15 ENCOUNTER — Telehealth: Payer: Self-pay

## 2020-10-15 DIAGNOSIS — J9383 Other pneumothorax: Secondary | ICD-10-CM

## 2020-10-15 NOTE — Telephone Encounter (Signed)
Patient referral to [facility] Dr. Chrissie Noa B. Hall at [phone] per pt request-OV notes, insurance card, imaging report, pt demographics. Pt was instructed to call their office to schedule an appointment.

## 2021-01-07 ENCOUNTER — Institutional Professional Consult (permissible substitution): Payer: Self-pay | Admitting: Pulmonary Disease
